# Patient Record
Sex: Female | Born: 1970 | Race: Black or African American | Hispanic: No | Smoking: Never smoker
Health system: Southern US, Community
[De-identification: ages and names within clinical notes are randomized; demographics above are authoritative.]

## PROBLEM LIST (undated history)

## (undated) DIAGNOSIS — N760 Acute vaginitis: Secondary | ICD-10-CM

## (undated) DIAGNOSIS — IMO0002 Reserved for concepts with insufficient information to code with codable children: Secondary | ICD-10-CM

## (undated) DIAGNOSIS — O99345 Other mental disorders complicating the puerperium: Secondary | ICD-10-CM

## (undated) DIAGNOSIS — B3731 Acute candidiasis of vulva and vagina: Secondary | ICD-10-CM

## (undated) DIAGNOSIS — N6320 Unspecified lump in the left breast, unspecified quadrant: Secondary | ICD-10-CM

## (undated) DIAGNOSIS — K5900 Constipation, unspecified: Secondary | ICD-10-CM

## (undated) DIAGNOSIS — K649 Unspecified hemorrhoids: Secondary | ICD-10-CM

## (undated) DIAGNOSIS — B9689 Other specified bacterial agents as the cause of diseases classified elsewhere: Secondary | ICD-10-CM

## (undated) DIAGNOSIS — L309 Dermatitis, unspecified: Secondary | ICD-10-CM

## (undated) DIAGNOSIS — M79662 Pain in left lower leg: Secondary | ICD-10-CM

## (undated) DIAGNOSIS — N6019 Diffuse cystic mastopathy of unspecified breast: Secondary | ICD-10-CM

## (undated) DIAGNOSIS — R519 Headache, unspecified: Secondary | ICD-10-CM

## (undated) DIAGNOSIS — N83209 Unspecified ovarian cyst, unspecified side: Secondary | ICD-10-CM

## (undated) DIAGNOSIS — B373 Candidiasis of vulva and vagina: Secondary | ICD-10-CM

## (undated) DIAGNOSIS — F53 Postpartum depression: Secondary | ICD-10-CM

## (undated) DIAGNOSIS — R6882 Decreased libido: Secondary | ICD-10-CM

## (undated) DIAGNOSIS — M79661 Pain in right lower leg: Secondary | ICD-10-CM

## (undated) DIAGNOSIS — D649 Anemia, unspecified: Secondary | ICD-10-CM

## (undated) DIAGNOSIS — G4700 Insomnia, unspecified: Secondary | ICD-10-CM

## (undated) HISTORY — DX: Acute candidiasis of vulva and vagina: B37.31

## (undated) HISTORY — DX: Diffuse cystic mastopathy of unspecified breast: N60.19

## (undated) HISTORY — DX: Constipation, unspecified: K59.00

## (undated) HISTORY — DX: Other mental disorders complicating the puerperium: O99.345

## (undated) HISTORY — PX: ABDOMINAL HYSTERECTOMY: SHX81

## (undated) HISTORY — DX: Dermatitis, unspecified: L30.9

## (undated) HISTORY — DX: Postpartum depression: F53.0

## (undated) HISTORY — DX: Other specified bacterial agents as the cause of diseases classified elsewhere: B96.89

## (undated) HISTORY — DX: Insomnia, unspecified: G47.00

## (undated) HISTORY — DX: Reserved for concepts with insufficient information to code with codable children: IMO0002

## (undated) HISTORY — DX: Candidiasis of vulva and vagina: B37.3

## (undated) HISTORY — DX: Unspecified lump in the left breast, unspecified quadrant: N63.20

## (undated) HISTORY — DX: Pain in left lower leg: M79.662

## (undated) HISTORY — PX: TUBAL LIGATION: SHX77

## (undated) HISTORY — DX: Unspecified hemorrhoids: K64.9

## (undated) HISTORY — DX: Decreased libido: R68.82

## (undated) HISTORY — DX: Pain in right lower leg: M79.661

## (undated) HISTORY — DX: Acute vaginitis: N76.0

## (undated) HISTORY — DX: Unspecified ovarian cyst, unspecified side: N83.209

---
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
## 1995-08-05 DIAGNOSIS — R87619 Unspecified abnormal cytological findings in specimens from cervix uteri: Secondary | ICD-10-CM

## 1995-08-05 DIAGNOSIS — IMO0002 Reserved for concepts with insufficient information to code with codable children: Secondary | ICD-10-CM

## 1995-08-05 HISTORY — DX: Reserved for concepts with insufficient information to code with codable children: IMO0002

## 1995-08-05 HISTORY — DX: Unspecified abnormal cytological findings in specimens from cervix uteri: R87.619

## 2001-01-23 ENCOUNTER — Other Ambulatory Visit: Admission: RE | Admit: 2001-01-23 | Discharge: 2001-01-23 | Payer: Self-pay | Admitting: Obstetrics and Gynecology

## 2001-01-30 ENCOUNTER — Encounter: Admission: RE | Admit: 2001-01-30 | Discharge: 2001-01-30 | Payer: Self-pay | Admitting: Obstetrics and Gynecology

## 2001-01-30 ENCOUNTER — Encounter: Payer: Self-pay | Admitting: Obstetrics and Gynecology

## 2002-01-22 ENCOUNTER — Other Ambulatory Visit: Admission: RE | Admit: 2002-01-22 | Discharge: 2002-01-22 | Payer: Self-pay | Admitting: Obstetrics and Gynecology

## 2002-02-06 ENCOUNTER — Encounter: Payer: Self-pay | Admitting: Obstetrics and Gynecology

## 2002-02-06 ENCOUNTER — Encounter: Admission: RE | Admit: 2002-02-06 | Discharge: 2002-02-06 | Payer: Self-pay | Admitting: Obstetrics and Gynecology

## 2003-03-14 ENCOUNTER — Inpatient Hospital Stay (HOSPITAL_COMMUNITY): Admission: AD | Admit: 2003-03-14 | Discharge: 2003-03-16 | Payer: Self-pay | Admitting: Obstetrics and Gynecology

## 2003-05-31 ENCOUNTER — Other Ambulatory Visit: Admission: RE | Admit: 2003-05-31 | Discharge: 2003-05-31 | Payer: Self-pay | Admitting: Obstetrics and Gynecology

## 2003-08-03 HISTORY — PX: BREAST CYST EXCISION: SHX579

## 2004-05-08 ENCOUNTER — Other Ambulatory Visit: Admission: RE | Admit: 2004-05-08 | Discharge: 2004-05-08 | Payer: Self-pay | Admitting: Obstetrics and Gynecology

## 2004-08-02 HISTORY — PX: TUBAL LIGATION: SHX77

## 2004-08-12 ENCOUNTER — Encounter: Admission: RE | Admit: 2004-08-12 | Discharge: 2004-08-12 | Payer: Self-pay | Admitting: Obstetrics and Gynecology

## 2004-08-28 ENCOUNTER — Encounter (INDEPENDENT_AMBULATORY_CARE_PROVIDER_SITE_OTHER): Payer: Self-pay | Admitting: Specialist

## 2004-08-28 ENCOUNTER — Ambulatory Visit (HOSPITAL_COMMUNITY): Admission: RE | Admit: 2004-08-28 | Discharge: 2004-08-28 | Payer: Self-pay | Admitting: *Deleted

## 2004-10-11 ENCOUNTER — Inpatient Hospital Stay (HOSPITAL_COMMUNITY): Admission: AD | Admit: 2004-10-11 | Discharge: 2004-10-13 | Payer: Self-pay | Admitting: Obstetrics and Gynecology

## 2004-10-14 ENCOUNTER — Encounter: Admission: RE | Admit: 2004-10-14 | Discharge: 2004-11-13 | Payer: Self-pay | Admitting: Obstetrics and Gynecology

## 2004-11-14 ENCOUNTER — Encounter: Admission: RE | Admit: 2004-11-14 | Discharge: 2004-12-14 | Payer: Self-pay | Admitting: Obstetrics and Gynecology

## 2006-02-18 ENCOUNTER — Other Ambulatory Visit: Admission: RE | Admit: 2006-02-18 | Discharge: 2006-02-18 | Payer: Self-pay | Admitting: Obstetrics and Gynecology

## 2006-02-22 ENCOUNTER — Encounter: Admission: RE | Admit: 2006-02-22 | Discharge: 2006-02-22 | Payer: Self-pay | Admitting: Obstetrics and Gynecology

## 2007-02-20 DIAGNOSIS — M79661 Pain in right lower leg: Secondary | ICD-10-CM

## 2007-02-20 HISTORY — DX: Pain in right lower leg: M79.661

## 2007-02-21 ENCOUNTER — Ambulatory Visit (HOSPITAL_COMMUNITY): Admission: RE | Admit: 2007-02-21 | Discharge: 2007-02-21 | Payer: Self-pay | Admitting: Obstetrics and Gynecology

## 2007-02-21 ENCOUNTER — Ambulatory Visit: Payer: Self-pay | Admitting: Vascular Surgery

## 2007-03-30 ENCOUNTER — Ambulatory Visit (HOSPITAL_COMMUNITY): Admission: RE | Admit: 2007-03-30 | Discharge: 2007-03-30 | Payer: Self-pay | Admitting: Obstetrics and Gynecology

## 2007-08-10 ENCOUNTER — Encounter: Admission: RE | Admit: 2007-08-10 | Discharge: 2007-08-10 | Payer: Self-pay | Admitting: Obstetrics and Gynecology

## 2008-08-20 ENCOUNTER — Encounter: Admission: RE | Admit: 2008-08-20 | Discharge: 2008-08-20 | Payer: Self-pay | Admitting: Obstetrics and Gynecology

## 2009-08-22 ENCOUNTER — Encounter: Admission: RE | Admit: 2009-08-22 | Discharge: 2009-08-22 | Payer: Self-pay | Admitting: Obstetrics and Gynecology

## 2010-08-24 ENCOUNTER — Encounter
Admission: RE | Admit: 2010-08-24 | Discharge: 2010-08-24 | Payer: Self-pay | Source: Home / Self Care | Attending: Obstetrics and Gynecology | Admitting: Obstetrics and Gynecology

## 2010-12-15 NOTE — Op Note (Signed)
[name], [name]               ACCOUNT NO.:  [id number]   MEDICAL RECORD NO.:  [id number]          PATIENT TYPE:  AMB   LOCATION:  SDC                           [facility]   PHYSICIAN:  Janine Limbo, M.D.DATE OF BIRTH:  Jan 25, 1971   DATE OF PROCEDURE:  03/30/2007  DATE OF DISCHARGE:                               OPERATIVE REPORT   PREOPERATIVE DIAGNOSIS:  Desires sterilization.   POSTOPERATIVE DIAGNOSIS:  Desires sterilization.   PROCEDURE:  Laparoscopic tubal cautery.   SURGEON:  Leonard Schwartz, MD   FIRST ASSISTANT:  None.   ANESTHETIC:  General.   DISPOSITION:  Ms. Rawlinson is a 40 year old female, para 3-2-3, who desires  sterilization.  She understands the indications for her surgical  procedure and she accepts the risks of, but not limited to, anesthetic  complications, bleeding, infections, possible damage to surrounding  organs, and possible tubal failure (24 per 1000).   FINDINGS:  The uterus, fallopian tubes and the ovaries were normal.  The  bowel was carefully inspected and the bowel appeared normal.  The  appendix and the liver appeared normal.  The gallbladder was visualized  and it appeared normal.   PROCEDURE:  The patient was taken to the operating room, where a general  anesthetic was given.  The patient's abdomen, perineum and vagina were  prepped with multiple layers of Betadine.  The bladder was drained of  urine.  A Hulka tenaculum was placed inside the uterus.  The patient was  sterilely draped.  The subumbilical area was injected with 5 mL of 0.5%  Marcaine with epinephrine.  An subumbilical incision was made and the  Veress needle was inserted into the abdominal cavity without difficulty.  Proper placement was confirmed using the saline drop test.  A  pneumoperitoneum was then obtained.  The laparoscopic trocar and then  the laparoscope were substituted for the Veress needle.  The pelvis was  visualized with findings as mentioned  above.  The right fallopian tube  was identified and followed to its fimbriated end.  The proximal portion  of the right fallopian tube was cauterized in several segments using the  bipolar cautery.  Hemostasis was adequate.  An identical procedure was  carried out on the opposite side.  Again hemostasis was adequate.  The  bowel was carefully inspected and there was no evidence of damage to the  bowel or any of the other pelvic structures.  The pneumoperitoneum was  allowed to escape.  The trocar was removed.  The fascia was closed using  a figure-of-eight suture of 0 Vicryl.  The skin was reapproximated using  a subcuticular suture of 3-0 Monocryl.  Sponge, needle and instrument  counts were correct on two occasions.  The estimated blood loss was less  than 2 mL.  The patient tolerated her procedure well.  She was awakened  from her anesthetic without difficulty and taken to the recovery room in  stable condition.   FOLLOW-UP INSTRUCTIONS:  The patient was given a prescription for  ibuprofen and she will take 800 mg every 8 hours as needed for  mild to  moderate pain.  She will take Vicodin one or two tablets every 4 hours  as needed  for severe pain.  She will return to see Dr. Stefano Gaul in 2-3 weeks for a  follow-up examination.  She was given a copy of the postoperative  instruction sheet as prepared by the [facility] for  patients who have undergone a laparoscopy.  She was told to call for  questions or concerns.      Janine Limbo, M.D.  Electronically Signed     AVS/MEDQ  D:  03/30/2007  T:  03/31/2007  Job:  [id number]
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
## 2010-12-18 NOTE — Op Note (Signed)
[name], [name]             ACCOUNT NO.:  [id number]   MEDICAL RECORD NO.:  [id number]          PATIENT TYPE:  AMB   LOCATION:  SDC                           [facility]   PHYSICIAN:  Vikki Ports, MDDATE OF BIRTH:  22-Jan-1971   DATE OF PROCEDURE:  08/28/2004  DATE OF DISCHARGE:                                 OPERATIVE REPORT   PREOPERATIVE DIAGNOSIS:  Right breast mass.   POSTOPERATIVE DIAGNOSIS:  Right breast mass.   PROCEDURE:  Excisional right breast biopsy.   SURGEON:  Danna Hefty, M.D.   ANESTHESIA:  Local MAC.   DESCRIPTION OF PROCEDURE:  The patient was taken to the operating room and  placed in a supine position.  After adequate MAC anesthesia was induced, the  right breast was prepped and draped in the normal sterile fashion.  Using 1%  lidocaine local anesthesia, the skin, subcutaneous tissues, and subumbilical  region of the right breast was anesthetized.  A curvilinear incision was  made.  I dissected down to a firm mass which was excised in its entirety and  sent for pathologic evaluation.  The skin was closed with subcuticular 4-0  Monocryl.  Steri-Strips and sterile dressings were applied.  The patient  tolerated the procedure well and went to PACU in good condition.      KRH/MEDQ  D:  08/28/2004  T:  08/29/2004  Job:  [id number]

## 2010-12-18 NOTE — H&P (Signed)
Wong, Caitlin                       ACCOUNT NO.:  [id number]   MEDICAL RECORD NO.:  [id number]                   PATIENT TYPE:  INP   LOCATION:  9164                                 [facility]   PHYSICIAN:  Janine Limbo, M.D.            DATE OF BIRTH:  [birth year]   DATE OF ADMISSION:  03/14/2003  DATE OF DISCHARGE:                                HISTORY & PHYSICAL   HISTORY OF PRESENT ILLNESS:  This is a 40 year old, gravida 4, para 1-0-2-1,  at 40-1/7 weeks who presents with contractions since 3 a.m.  She denies  leaking or bleeding.  Reports positive fetal movement.  Pregnancy has been  followed by the certified nurse midwife service and remarkable for:  1)  Unsure dates.  2) Group B Strep negative.   PRENATAL LABORATORY DATA:  Hemoglobin 11.6, platelets 315, blood type A  positive, antibody screen negative, sickle cell screen negative. RPR  nonreactive. Rubella immune.  Hepatitis B surface antigen negative.  HIV  nonreactive.  Pap test normal. Gonorrhea negative.  Chlamydia negative.  Cystic fibrosis negative.  Glucose Challenge within normal limits.  Quad  screen within normal limits.  Group B Strep negative.   PAST OBSTETRICAL HISTORY:  Remarkable for an elective abortion in 1992 at 12  weeks with no complications.  Vaginal delivery in 1997 of a female infant at  [redacted] weeks gestation weighing 8 pounds 2 ounces, complicated by meconium fluid  and an unknown type of abortion in 2003 at six weeks gestation with no  complications.   PAST MEDICAL HISTORY:  Remarkable for an abnormal Pap smear for which she  had colposcopy in 1997 and normal Pap smears ever since.  History of  Gonorrhea at age 53.  History of HPV. Childhood varicella.  History of past  marijuana use.   PAST SURGICAL HISTORY:  Remarkable for an elective abortion in 1992 and a  wisdom teeth extraction in 2003.   FAMILY HISTORY:  Remarkable for grandfather with heart disease.  Mother,  grandfather, and aunt with hypertension.  Mother and aunt with diabetes.  Sister with sarcoidosis.  Sister with breast cancer.  Grandmother with a  stroke. Mother with seizures.  Genetic history is unremarkable.   SOCIAL HISTORY:  The patient is single, but involved with Berenice Primas who is  involved and supportive. She is of the [employer]. She denies any current  alcohol, tobacco, or drug use.   PHYSICAL EXAMINATION:  VITAL SIGNS:  Stable, afebrile.  HEENT:  Within normal limits.  Thyroid normal, not enlarged.  CHEST:  Clear to auscultation.  HEART:  Regular rate and rhythm.  ABDOMEN: Gravid at 38 cm. Vertex to [place].  EFM shows a reactive fetal  heart rate tracing with uterine contractions every five minutes.  PELVIC: Cervix 3 cm, 90% effaced, and -1 station with vertex presentation.  EXTREMITIES:  Within normal limits.   ASSESSMENT:  1. Intrauterine pregnancy at  term.  2. Early active labor with cervical change.   PLAN:  1. Admit to birthing suite, Dr. Stefano Gaul notified.  2. Routine C.N.M. orders.  3. Anticipate SVD.     Marie L. Williams, C.N.M.                 Janine Limbo, M.D.    MLW/MEDQ  D:  03/14/2003  T:  03/14/2003  Job:  [id number]

## 2011-05-14 LAB — URINALYSIS, ROUTINE W REFLEX MICROSCOPIC
Bilirubin Urine: NEGATIVE
Specific Gravity, Urine: >1.03 — ABNORMAL HIGH
Urobilinogen, UA: 0.2
pH: 6

## 2011-05-14 LAB — CBC
MCHC: 34
MCV: 86.7
Platelets: 291
RBC: 4.07
RDW: 14.6 — ABNORMAL HIGH
WBC: 4.4

## 2011-07-23 ENCOUNTER — Other Ambulatory Visit: Payer: Self-pay | Admitting: Obstetrics and Gynecology

## 2011-07-23 DIAGNOSIS — Z1231 Encounter for screening mammogram for malignant neoplasm of breast: Secondary | ICD-10-CM

## 2011-08-26 ENCOUNTER — Ambulatory Visit
Admission: RE | Admit: 2011-08-26 | Discharge: 2011-08-26 | Disposition: A | Discharge status: Home / Self Care | Payer: 59 | Source: Ambulatory Visit | Attending: Obstetrics and Gynecology | Admitting: Obstetrics and Gynecology

## 2011-08-26 DIAGNOSIS — Z1231 Encounter for screening mammogram for malignant neoplasm of breast: Secondary | ICD-10-CM

## 2011-09-01 ENCOUNTER — Other Ambulatory Visit: Payer: Self-pay | Admitting: Obstetrics and Gynecology

## 2011-09-01 DIAGNOSIS — R928 Other abnormal and inconclusive findings on diagnostic imaging of breast: Secondary | ICD-10-CM

## 2011-09-08 ENCOUNTER — Ambulatory Visit
Admission: RE | Admit: 2011-09-08 | Discharge: 2011-09-08 | Disposition: A | Discharge status: Home / Self Care | Payer: 59 | Source: Ambulatory Visit | Attending: Obstetrics and Gynecology | Admitting: Obstetrics and Gynecology

## 2011-09-08 DIAGNOSIS — R928 Other abnormal and inconclusive findings on diagnostic imaging of breast: Secondary | ICD-10-CM

## 2012-04-04 ENCOUNTER — Other Ambulatory Visit: Payer: Self-pay | Admitting: Obstetrics and Gynecology

## 2012-04-04 DIAGNOSIS — N6009 Solitary cyst of unspecified breast: Secondary | ICD-10-CM

## 2012-04-07 ENCOUNTER — Ambulatory Visit
Admission: RE | Admit: 2012-04-07 | Discharge: 2012-04-07 | Disposition: A | Discharge status: Home / Self Care | Payer: 59 | Source: Ambulatory Visit | Attending: Obstetrics and Gynecology | Admitting: Obstetrics and Gynecology

## 2012-04-07 DIAGNOSIS — N6009 Solitary cyst of unspecified breast: Secondary | ICD-10-CM

## 2012-04-17 ENCOUNTER — Ambulatory Visit (INDEPENDENT_AMBULATORY_CARE_PROVIDER_SITE_OTHER): Payer: 59 | Admitting: Obstetrics and Gynecology

## 2012-04-17 ENCOUNTER — Encounter: Payer: Self-pay | Admitting: Obstetrics and Gynecology

## 2012-04-17 VITALS — BP 112/70 | HR 72 | Ht 66.0 in | Wt 162.0 lb

## 2012-04-17 DIAGNOSIS — Z01419 Encounter for gynecological examination (general) (routine) without abnormal findings: Secondary | ICD-10-CM

## 2012-04-17 DIAGNOSIS — N6009 Solitary cyst of unspecified breast: Secondary | ICD-10-CM

## 2012-04-17 DIAGNOSIS — Z113 Encounter for screening for infections with a predominantly sexual mode of transmission: Secondary | ICD-10-CM

## 2012-04-17 DIAGNOSIS — Z124 Encounter for screening for malignant neoplasm of cervix: Secondary | ICD-10-CM

## 2012-04-17 NOTE — Progress Notes (Signed)
Subjective:    [name] is a 41 y.o. female, G5P3, who presents for an annual exam. The patient denies any complaints.  Menstrual cycle:   LMP: Patient's last menstrual period was 04/05/2012.            Review of Systems Pertinent items are noted in HPI. Denies pelvic pain, urinary tract symptoms, vaginitis symptoms, irregular bleeding, menopausal symptoms, change in bowel habits or rectal bleeding   Objective:    BP 112/70  Pulse 72  Ht 5\' 6"  (1.676 m)  Wt 162 lb (73.483 kg)  BMI 26.15 kg/m2  LMP 04/05/2012   Wt Readings from Last 1 Encounters:  04/17/12 162 lb (73.483 kg)   Body mass index is 26.15 kg/(m^2). General Appearance: Alert, no acute distress HEENT: Grossly normal Neck / Thyroid: Supple, no thyromegaly or cervical adenopathy Lungs: Clear to auscultation bilaterally Back: No CVA tenderness Breast Exam: No focal  masses but fibrocystic breast changes;No dimpling, nipple retraction or discharge. Cardiovascular: Regular rate and rhythm.  Gastrointestinal: Soft, non-tender, no masses or organomegaly Pelvic Exam: EGBUS-wnl, vagina-normal rugae, cervix- without lesions or tenderness, uterus appears normal size shape and consistency, adnexae-no masses or tenderness Lymphatic Exam: Non-palpable nodes in neck, clavicular,  axillary, or inguinal regions  Skin: no rashes or abnormalities Extremities: no clubbing cyanosis or edema  Neurologic: grossly normal Psychiatric: Alert and oriented   Assessment:   Routine GYN Exam First Degree Relative Breast Cancer Stable Right Breast Cysts (per mammography)   Plan:  STD testing   Offered consultation with a general for breast cysts offered, patient declined for now.  Will continue radiographic monitoring  PAP sent RTO 1 year or prn  Janelie Goltz,ELMIRAPA-C

## 2012-04-17 NOTE — Progress Notes (Signed)
Regular Periods: yes Mammogram: yes  Monthly Breast Ex.: no Exercise: yes  Tetanus < 10 years: no Seatbelts: yes  NI. Bladder Functn.: yes Abuse at home: no  Daily BM's: no Stressful Work: no  Healthy Diet: yes Sigmoid-Colonoscopy: 1996  Calcium: no Medical problems this year: NO PROBLEMS   LAST PAP:9/12  NL  Contraception: BTL  Mammogram:  3/13  FOUND 3 MASSES   HAD U/S; OKAY  PCP: NO  PMH: NO CHANGE  FMH: NO CHANGE  Last Bone Scan: 2002   PT IS MARRIED.

## 2012-04-18 LAB — HEPATITIS B SURFACE ANTIGEN: Hepatitis B Surface Ag: NEGATIVE

## 2012-04-19 LAB — PAP IG, CT-NG, RFX HPV ASCU: GC Probe Amp: NEGATIVE

## 2012-08-29 ENCOUNTER — Other Ambulatory Visit: Payer: Self-pay | Admitting: Obstetrics and Gynecology

## 2012-08-29 DIAGNOSIS — N6009 Solitary cyst of unspecified breast: Secondary | ICD-10-CM

## 2012-09-11 ENCOUNTER — Ambulatory Visit
Admission: RE | Admit: 2012-09-11 | Discharge: 2012-09-11 | Disposition: A | Discharge status: Home / Self Care | Payer: 59 | Source: Ambulatory Visit | Attending: Obstetrics and Gynecology | Admitting: Obstetrics and Gynecology

## 2012-09-11 DIAGNOSIS — N6009 Solitary cyst of unspecified breast: Secondary | ICD-10-CM

## 2013-06-26 ENCOUNTER — Emergency Department (HOSPITAL_BASED_OUTPATIENT_CLINIC_OR_DEPARTMENT_OTHER)
Admission: EM | Admit: 2013-06-26 | Discharge: 2013-06-26 | Disposition: A | Discharge status: Home / Self Care | Payer: 59 | Attending: Emergency Medicine | Admitting: Emergency Medicine

## 2013-06-26 ENCOUNTER — Emergency Department (HOSPITAL_BASED_OUTPATIENT_CLINIC_OR_DEPARTMENT_OTHER): Payer: 59

## 2013-06-26 ENCOUNTER — Encounter (HOSPITAL_BASED_OUTPATIENT_CLINIC_OR_DEPARTMENT_OTHER): Payer: Self-pay | Admitting: Emergency Medicine

## 2013-06-26 DIAGNOSIS — Z8719 Personal history of other diseases of the digestive system: Secondary | ICD-10-CM | POA: Insufficient documentation

## 2013-06-26 DIAGNOSIS — Z8619 Personal history of other infectious and parasitic diseases: Secondary | ICD-10-CM | POA: Insufficient documentation

## 2013-06-26 DIAGNOSIS — R109 Unspecified abdominal pain: Secondary | ICD-10-CM

## 2013-06-26 DIAGNOSIS — Z87448 Personal history of other diseases of urinary system: Secondary | ICD-10-CM | POA: Insufficient documentation

## 2013-06-26 DIAGNOSIS — Z3202 Encounter for pregnancy test, result negative: Secondary | ICD-10-CM | POA: Insufficient documentation

## 2013-06-26 DIAGNOSIS — R1032 Left lower quadrant pain: Secondary | ICD-10-CM | POA: Insufficient documentation

## 2013-06-26 DIAGNOSIS — Z8742 Personal history of other diseases of the female genital tract: Secondary | ICD-10-CM | POA: Insufficient documentation

## 2013-06-26 DIAGNOSIS — Z9851 Tubal ligation status: Secondary | ICD-10-CM | POA: Insufficient documentation

## 2013-06-26 DIAGNOSIS — Z8679 Personal history of other diseases of the circulatory system: Secondary | ICD-10-CM | POA: Insufficient documentation

## 2013-06-26 DIAGNOSIS — Z872 Personal history of diseases of the skin and subcutaneous tissue: Secondary | ICD-10-CM | POA: Insufficient documentation

## 2013-06-26 DIAGNOSIS — Z8659 Personal history of other mental and behavioral disorders: Secondary | ICD-10-CM | POA: Insufficient documentation

## 2013-06-26 DIAGNOSIS — Z87440 Personal history of urinary (tract) infections: Secondary | ICD-10-CM | POA: Insufficient documentation

## 2013-06-26 LAB — COMPREHENSIVE METABOLIC PANEL
ALT: 7 U/L (ref 0–35)
Albumin: 4 g/dL (ref 3.5–5.2)
Alkaline Phosphatase: 33 U/L — ABNORMAL LOW (ref 39–117)
Calcium: 9.2 mg/dL (ref 8.4–10.5)
GFR calc Af Amer: >90 mL/min (ref >90)
Glucose, Bld: 87 mg/dL (ref 70–99)
Sodium: 139 mEq/L (ref 135–145)
Total Protein: 7.4 g/dL (ref 6.0–8.3)

## 2013-06-26 LAB — CBC
HCT: 38.3 % (ref 36.0–46.0)
Hemoglobin: 12.2 g/dL (ref 12.0–15.0)
MCH: 28.5 pg (ref 26.0–34.0)
MCHC: 31.9 g/dL (ref 30.0–36.0)
MCV: 89.5 fL (ref 78.0–100.0)
Platelets: 268 K/uL (ref 150–400)
RBC: 4.28 MIL/uL (ref 3.87–5.11)
RDW: 13.8 % (ref 11.5–15.5)
WBC: 5.8 K/uL (ref 4.0–10.5)

## 2013-06-26 LAB — POCT I-STAT, CHEM 8
Chloride: 105 mEq/L (ref 96–112)
Creatinine, Ser: 0.7 mg/dL (ref 0.50–1.10)
Glucose, Bld: 87 mg/dL (ref 70–99)
HCT: 40 % (ref 36.0–46.0)
Hemoglobin: 13.6 g/dL (ref 12.0–15.0)
Potassium: 4 mEq/L (ref 3.5–5.1)
Sodium: 142 mEq/L (ref 135–145)
TCO2: 26 mmol/L (ref 0–100)

## 2013-06-26 LAB — PREGNANCY, URINE: Preg Test, Ur: NEGATIVE

## 2013-06-26 LAB — URINALYSIS, ROUTINE W REFLEX MICROSCOPIC
Glucose, UA: NEGATIVE mg/dL
Ketones, ur: NEGATIVE mg/dL
Protein, ur: NEGATIVE mg/dL
Specific Gravity, Urine: 1.025 (ref 1.005–1.030)
Urobilinogen, UA: 1 mg/dL (ref 0.0–1.0)
pH: 7 (ref 5.0–8.0)

## 2013-06-26 LAB — LIPASE, BLOOD: Lipase: 30 U/L (ref 11–59)

## 2013-06-26 MED ORDER — IOHEXOL 300 MG/ML  SOLN
50.0000 mL | Freq: Once | INTRAMUSCULAR | Status: AC | PRN
  Administered 2013-06-26: 50 mL via ORAL

## 2013-06-26 MED ORDER — MORPHINE SULFATE 4 MG/ML IJ SOLN
4.0000 mg | Freq: Once | INTRAMUSCULAR | Status: AC
  Administered 2013-06-26: 4 mg via INTRAVENOUS
  Filled 2013-06-26: qty 1

## 2013-06-26 MED ORDER — IOHEXOL 300 MG/ML  SOLN
100.0000 mL | Freq: Once | INTRAMUSCULAR | Status: AC | PRN
  Administered 2013-06-26: 100 mL via INTRAVENOUS

## 2013-06-26 MED ORDER — MORPHINE SULFATE 2 MG/ML IJ SOLN
2.0000 mg | Freq: Once | INTRAMUSCULAR | Status: AC
  Administered 2013-06-26: 2 mg via INTRAVENOUS
  Filled 2013-06-26: qty 1

## 2013-06-26 MED ORDER — HYDROCODONE-ACETAMINOPHEN 5-325 MG PO TABS: 1.0000 | ORAL_TABLET | Freq: Four times a day (QID) | ORAL | Status: DC | PRN

## 2013-06-26 NOTE — ED Notes (Signed)
Pt c/o left flank pain for "over a year" and sts the pain has been worse past 2 wks. Pt denies n/v/d.

## 2013-06-26 NOTE — ED Provider Notes (Signed)
CSN: [id number]     Arrival date & time 06/26/13  1054 History   First MD Initiated Contact with Patient 06/26/13 1103     Chief Complaint  Patient presents with  . Flank Pain   (Consider location/radiation/quality/duration/timing/severity/associated sxs/prior Treatment) Patient is a 42 y.o. female presenting with flank pain.  Flank Pain Pertinent negatives include no abdominal pain, chest pain, chills, coughing, fever, headaches or sore throat.    42 y/o female with nearly 20 year Hx of L flank pain taht is drastically increased for the last 2 weeks. She states that the pain started without apparent cause, trauma, or rash and has continued for years ata reasonable mild to moderate level. Recently she has ahd an increase in pain describing 11/10 (worse than childbirth) L flank pain described as a pressure like pain radiating straight up. It is worsened by laying on it and is not alleviated by anything. She was previously told it may be constipation so she took mag citrate this weekend which produced several stool sand only worsened the pain. She also endorses shallow breathing as it hurts to take deep inspiration.   Past Medical History  Diagnosis Date  . Post partum depression   . Eczema   . BV (bacterial vaginosis)   . Candidal vulvovaginitis   . Decreased libido   . Bilateral calf pain 02/20/07  . Left breast mass   . Insomnia   . Hemorrhoids   . Constipation   . Simple ovarian cyst   . Fibrocystic breast   . Abnormal Pap smear 08/05/95    LSIL CIN1   Past Surgical History  Procedure Laterality Date  . Tubal ligation    . Breast cyst excision  2005  . Knee surgery  1986    LEFT   Family History  Problem Relation Age of Onset  . Diabetes Mother   . Alcohol abuse Mother   . Cancer Sister     BREAST  . Diabetes Maternal Grandmother   . Aneurysm Maternal Grandmother   . Cancer Paternal Grandmother   . Hypertension Sister   . Sarcoidosis Sister    History  Substance  Use Topics  . Smoking status: Never Smoker   . Smokeless tobacco: Never Used  . Alcohol Use: Yes   OB History   Grav Para Term Preterm Abortions TAB SAB Ect Mult Living   5 3        3      Review of Systems  Constitutional: Negative for fever, chills and appetite change.  HENT: Negative for sore throat.   Respiratory: Negative for cough and shortness of breath.        Shallow breathing  Cardiovascular: Negative for chest pain.  Gastrointestinal: Negative for abdominal pain.  Genitourinary: Positive for flank pain. Negative for dysuria.  Neurological: Negative for headaches.  All other systems reviewed and are negative.    Allergies  Review of patient's allergies indicates no known allergies.  Home Medications   Current Outpatient Rx  Name  Route  Sig  Dispense  Refill  . HYDROcodone-acetaminophen (NORCO) 5-325 MG per tablet   Oral   Take 1 tablet by mouth every 6 (six) hours as needed for moderate pain.   30 tablet   0    BP 105/74  Pulse 66  Temp(Src) 97.9 F (36.6 C) (Oral)  Resp 18  SpO2 100%  LMP 06/23/2013 Physical Exam  Nursing note and vitals reviewed. Constitutional: She is oriented to person, place, and time. She appears  well-developed and well-nourished. No distress.  HENT:  Head: Normocephalic and atraumatic.  Eyes: EOM are normal. Pupils are equal, round, and reactive to light.  Neck: Neck supple.  Cardiovascular: Normal rate, regular rhythm and normal heart sounds.   No murmur heard. Pulmonary/Chest: Effort normal and breath sounds normal.  Abdominal: Soft. Bowel sounds are normal. There is no tenderness. There is no guarding.  Musculoskeletal: She exhibits no edema.  Neurological: She is alert and oriented to person, place, and time.  Skin: Skin is warm and dry. No rash noted. She is not diaphoretic.  Psychiatric: She has a normal mood and affect.    ED Course  Procedures (including critical care time) Labs Review Labs Reviewed   URINALYSIS, ROUTINE W REFLEX MICROSCOPIC - Abnormal; Notable for the following:    APPearance CLOUDY (*)    All other components within normal limits  COMPREHENSIVE METABOLIC PANEL - Abnormal; Notable for the following:    Alkaline Phosphatase 33 (*)    All other components within normal limits  POCT I-STAT, CHEM 8 - Abnormal; Notable for the following:    Calcium, Ion 1.26 (*)    All other components within normal limits  CBC  PREGNANCY, URINE  LIPASE, BLOOD   Imaging Review Ct Abdomen Pelvis W Contrast  06/26/2013   CLINICAL DATA:  Left lower quadrant pain  EXAM: CT ABDOMEN AND PELVIS WITH CONTRAST  TECHNIQUE: Multidetector CT imaging of the abdomen and pelvis was performed using the standard protocol following bolus administration of intravenous contrast.  CONTRAST:  50mL OMNIPAQUE IOHEXOL 300 MG/ML SOLN, OMNIPAQUE IOHEXOL 300 MG/ML SOLN  COMPARISON:  None.  FINDINGS: Diffuse hepatic steatosis.  The gallbladder, spleen, adrenal gland, and kidneys are within normal limits.  The pancreatic parenchyma is unremarkable. The pancreatic duct is mildly dilated at 5 mm.  Normal appendix.  Moderate stool burden throughout the length of the colon.  Bladder is decompressed. Uterus and adnexa are within normal limits. Trace free fluid is seen layering in the pelvis.  The stomach is distended with oral contrast.  No acute bony deformity.  IMPRESSION: Mild dilatation of the pancreatic duct without mass. Correlate with amylase and lipase as for the need for MRCP.  Diffuse hepatic steatosis.   Electronically Signed   By: Maryclare Bean M.D.   On: 06/26/2013 12:50    EKG Interpretation   None       MDM   1. Abdominal pain     42 y/o female here with exacerbation of chronic L flank pain. Labs completely unrevealing with normal LFTs and lipase. CT abdomen with only shows mildly dilated pancreatic duct. Discussed findings with patient recommend PCP and GI followup. Did contact GI to discuss case he  feels comfortable starting patient with close followup.  Pain control here with IV morphine Rx given for by mouth Percocet  Red flags provided, return for worsening symptoms.  Murtis Sink, MD [facility] Resident, PGY-2 06/26/2013, 3:30 PM       Elenora Gamma, MD 06/26/13 [id number]

## 2013-06-26 NOTE — ED Notes (Signed)
Patient preparing for discharge. 

## 2013-06-26 NOTE — ED Provider Notes (Signed)
I saw and evaluated the patient, reviewed the resident's note and I agree with the findings and plan.  EKG Interpretation   None       Patient here with chronic abdominal pain worse with past 2 weeks. Belly is benign with some mild left-sided tenderness to palpation. CT scan shows mildly dilated common bile duct. GI states this, moderate dilation is likely normal and is not in acute followup. She is to followup provided from her PCP. Stable for discharge  Dagmar Hait, MD 06/26/13 1539

## 2013-07-05 ENCOUNTER — Other Ambulatory Visit (INDEPENDENT_AMBULATORY_CARE_PROVIDER_SITE_OTHER): Payer: 59

## 2013-07-05 ENCOUNTER — Ambulatory Visit (INDEPENDENT_AMBULATORY_CARE_PROVIDER_SITE_OTHER): Payer: 59 | Admitting: Gastroenterology

## 2013-07-05 ENCOUNTER — Encounter: Payer: Self-pay | Admitting: Gastroenterology

## 2013-07-05 VITALS — BP 98/60 | HR 93 | Ht 65.5 in | Wt 146.2 lb

## 2013-07-05 DIAGNOSIS — R1032 Left lower quadrant pain: Secondary | ICD-10-CM

## 2013-07-05 DIAGNOSIS — K8689 Other specified diseases of pancreas: Secondary | ICD-10-CM

## 2013-07-05 DIAGNOSIS — K5900 Constipation, unspecified: Secondary | ICD-10-CM

## 2013-07-05 DIAGNOSIS — R1012 Left upper quadrant pain: Secondary | ICD-10-CM

## 2013-07-05 LAB — IBC PANEL
Iron: 39 ug/dL — ABNORMAL LOW (ref 42–145)
Saturation Ratios: 10.7 % — ABNORMAL LOW (ref 20.0–50.0)
Transferrin: 259.9 mg/dL (ref 212.0–360.0)

## 2013-07-05 LAB — VITAMIN B12: Vitamin B-12: 233 pg/mL (ref 211–911)

## 2013-07-05 LAB — C-REACTIVE PROTEIN: CRP: <0.5 mg/dL (ref 0.5–20.0)

## 2013-07-05 LAB — LIPASE: Lipase: 27 U/L (ref 11.0–59.0)

## 2013-07-05 LAB — FOLATE: Folate: 12.9 ng/mL (ref >5.9)

## 2013-07-05 MED ORDER — NA SULFATE-K SULFATE-MG SULF 17.5-3.13-1.6 GM/177ML PO SOLN: ORAL | Status: DC

## 2013-07-05 MED ORDER — TAPENTADOL HCL 50 MG PO TABS: 50.0000 mg | ORAL_TABLET | Freq: Four times a day (QID) | ORAL | Status: DC | PRN

## 2013-07-05 MED ORDER — LINACLOTIDE 290 MCG PO CAPS: 290.0000 ug | ORAL_CAPSULE | Freq: Every day | ORAL | Status: DC

## 2013-07-05 MED ORDER — CILIDINIUM-CHLORDIAZEPOXIDE 2.5-5 MG PO CAPS: 1.0000 | ORAL_CAPSULE | Freq: Three times a day (TID) | ORAL | Status: DC

## 2013-07-05 NOTE — Patient Instructions (Addendum)
You have been scheduled for an endoscopy and colonoscopy with propofol. Please follow the written instructions given to you at your visit today. Please pick up your prep at the pharmacy within the next 1-3 days. If you use inhalers (even only as needed), please bring them with you on the day of your procedure. Your physician has requested that you go to www.startemmi.com and enter the access code given to you at your visit today. This web site gives a general overview about your procedure. However, you should still follow specific instructions given to you by our office regarding your preparation for the procedure.  Please purchase Miralax over the counter and mix one capful in one glass of water or juice and drink at bedtime.  We have given you samples of the following medication to take: Linzess 290 mcg, please take one capsule on an empty stomach once daily.  We have sent the following medications to your pharmacy for you to pick up at your convenience: Librax 5-2.5 mg, please take one capsule by mouth three times daily as needed  Nucynta 50 mg, please take one tablet by mouth every six hours as needed for pain   Please go to basement for lab work

## 2013-07-05 NOTE — Progress Notes (Signed)
History of Present Illness:  This is a 42 year old African American female who has had one year of left flank pain with negative urinalysis.  Her pain is associated with mild functional constipation and is relocated left lower quadrant.  She was recently in the emergency room on November 25 with severe left-sided pain, underwent CT scan which was normal except for 5 mm pancreatic duct.  Liver function tests, amylase and lipase were normal.  Patient says her pain is only alleviated by laying on her left side.  His not made worse by food, but she has had some mild anorexia and worsening constipation.  She has a bowel movement now every 3-4 days.  She denies a known history of hepatitis or pancreatitis, abuse of alcohol, cigarettes, or NSAIDs.  She denies reflux symptoms or dysphagia.  Her family history is positive for colon polyps in her mother and her sister.  She denies a specific food intolerances.  She denies systemic complaints such as fever, chills, nausea vomiting, skin rashes, joint pains et Karie Soda.  She's not had previous endoscopic exams or colonoscopy.  The patient relates that morphine and hydrocodone do not seem to help her pain which is very unusual.  Review of her labs shows normal liver function tests, amylase, lipase, CBC,  Urinalysis, negative pregnancy test.l  I have reviewed this patient's present history, medical and surgical past history, allergies and medications.     ROS:   All systems were reviewed and are negative unless otherwise stated in the HPI.    Physical Exam: Blood pressure 90/60, pulse 93 and regular and weight 146 with a BMI of 23.96. General well developed well nourished patient in no acute distress, appearing their stated age Eyes PERRLA, no icterus, fundoscopic exam per opthamologist Skin no lesions noted Neck supple, no adenopathy, no thyroid enlargement, no tenderness Chest clear to percussion and auscultation Heart no significant murmurs, gallops or rubs  noted Abdomen no hepatosplenomegaly masses or tenderness, BS normal.  Tender spasm of a mobile sigmoid colon which appears to be full of stool.  Is no rebound tenderness. Rectal inspection normal no fissures, or fistulae noted.  No masses or tenderness on digital exam. Stool guaiac negative. Extremities no acute joint lesions, edema, phlebitis or evidence of cellulitis. Neurologic patient oriented x 3, cranial nerves intact, no focal neurologic deficits noted. Psychological mental status normal and normal affect.  Assessment and plan: Her pain seemed to be localized over her colon area and is associated with chronic constipation.  I've scheduled colonoscopy exam, also endoscopy be complete because of her abnormal CT scan.  I do not think she has any evidence of pancreatic disease by lab exam, but a 5 mm PD and her pain radiating to her left upper quadrant is worrisome for possible chronic pancreatic issues.  I have ordered repeat amylase, lipase, CRP, sedimentation rate, IgG subclass 4 for autoimmune pancreatitis, and have started Linzess 290 mcg a day for constipation with MiraLax at bedtime, Nucynta 50 mg every 6-8 hours for pain, Librax one by mouth 3 times a day, and we will do her procedures next week.  She we have no answer to her problems from above workup, we'll proceed with MRCP versus endoscopic ultrasonography exam of her pancreas and biliary tree.

## 2013-07-06 ENCOUNTER — Encounter: Payer: Self-pay | Admitting: Gastroenterology

## 2013-07-09 ENCOUNTER — Ambulatory Visit (AMBULATORY_SURGERY_CENTER): Payer: 59 | Admitting: Gastroenterology

## 2013-07-09 ENCOUNTER — Other Ambulatory Visit: Payer: Self-pay

## 2013-07-09 ENCOUNTER — Encounter: Payer: Self-pay | Admitting: Gastroenterology

## 2013-07-09 VITALS — BP 124/78 | HR 74 | Temp 98.2°F | Resp 12 | Ht 65.5 in | Wt 146.0 lb

## 2013-07-09 DIAGNOSIS — K8689 Other specified diseases of pancreas: Secondary | ICD-10-CM

## 2013-07-09 DIAGNOSIS — Z8371 Family history of colonic polyps: Secondary | ICD-10-CM

## 2013-07-09 DIAGNOSIS — K219 Gastro-esophageal reflux disease without esophagitis: Secondary | ICD-10-CM

## 2013-07-09 DIAGNOSIS — R1032 Left lower quadrant pain: Secondary | ICD-10-CM

## 2013-07-09 LAB — IGG SUBCLASSES
IgG Subclass 1: 645 mg/dL (ref 382–929)
IgG Subclass 2: 361 mg/dL (ref 241–700)
IgG Subclass 4: 6.1 mg/dL (ref 4.0–86.0)

## 2013-07-09 MED ORDER — ESOMEPRAZOLE MAGNESIUM 40 MG PO CPDR: 40.0000 mg | DELAYED_RELEASE_CAPSULE | Freq: Every day | ORAL | Status: DC

## 2013-07-09 MED ORDER — CILIDINIUM-CHLORDIAZEPOXIDE 2.5-5 MG PO CAPS: 1.0000 | ORAL_CAPSULE | Freq: Three times a day (TID) | ORAL | Status: DC

## 2013-07-09 MED ORDER — LINACLOTIDE 290 MCG PO CAPS: 290.0000 ug | ORAL_CAPSULE | Freq: Every day | ORAL | Status: DC

## 2013-07-09 MED ORDER — SODIUM CHLORIDE 0.9 % IV SOLN: 500.0000 mL | INTRAVENOUS | Status: DC

## 2013-07-09 NOTE — Op Note (Signed)
[facility] [address] [place], [address]   COLONOSCOPY PROCEDURE REPORT  PATIENT: [name], [name]  MR#: [id number] BIRTHDATE: 1971-01-22 , 42  yrs. old GENDER: Female ENDOSCOPIST: Mardella Layman, MD, [facility] REFERRED BY: PROCEDURE DATE:  07/09/2013 PROCEDURE:   Colonoscopy, screening First Screening Colonoscopy - Avg.  risk and is 50 yrs.  old or older Yes.  Prior Negative Screening - Now for repeat screening. N/A  History of Adenoma - Now for follow-up colonoscopy & has been > or = to 3 yrs.  N/A ASA CLASS:   Class I INDICATIONS:average risk screening and abdominal pain in the lower left quadrant. MEDICATIONS: propofol (Diprivan) 350mg  IV  DESCRIPTION OF PROCEDURE:   After the risks benefits and alternatives of the procedure were thoroughly explained, informed consent was obtained.  A digital rectal exam revealed no abnormalities of the rectum.   The LB [id number]  endoscope was introduced through the anus and advanced to the   . No adverse events experienced.   The quality of the prep was excellent, using MoviPrep  The instrument was then slowly withdrawn as the colon was fully examined.      COLON FINDINGS: A normal appearing cecum, ileocecal valve, and appendiceal orifice were identified.  The ascending, hepatic flexure, transverse, splenic flexure, descending, sigmoid colon and rectum appeared unremarkable.  No polyps or cancers were seen. Retroflexed views revealed no abnormalities. The time to cecum=6 minutes 45 seconds.  Withdrawal time=7 minutes 29 seconds.  The scope was withdrawn and the procedure completed. COMPLICATIONS: There were no complications.  ENDOSCOPIC IMPRESSION: Normal colon ...no cause for severe abdominal pain syndrome.....?? IBS vs UGI source vs pancreatic problem.  RECOMMENDATIONS: Upper Endoscopy   eSigned:  Mardella Layman, MD, [facility] 07/09/2013 2:36 PM   cc:

## 2013-07-09 NOTE — Progress Notes (Signed)
Report to pacu rn, vss, bbs=clear 

## 2013-07-09 NOTE — Progress Notes (Signed)
Patient did not experience any of the following events: a burn prior to discharge; a fall within the facility; wrong site/side/patient/procedure/implant event; or a hospital transfer or hospital admission upon discharge from the facility. (G8907) Patient did not have preoperative order for IV antibiotic SSI prophylaxis. (G8918)  

## 2013-07-09 NOTE — Progress Notes (Signed)
Per Dr. Jarold Motto, samples of Linzess 290 mcg sent up stairs and rx for Nexium 40mg  sent in to [facility] , also faxed printed rx refill for his librax to [facility] .

## 2013-07-09 NOTE — Patient Instructions (Addendum)

## 2013-07-09 NOTE — Progress Notes (Signed)
Patient did not have preoperative order for IV antibiotic SSI prophylaxis. (G8918)  Patient did not experience any of the following events: a burn prior to discharge; a fall within the facility; wrong site/side/patient/procedure/implant event; or a hospital transfer or hospital admission upon discharge from the facility. (G8907)  

## 2013-07-09 NOTE — Op Note (Signed)
[facility] [address] [place], [address]   ENDOSCOPY PROCEDURE REPORT  PATIENT: [name], [name]  MR#: [id number] BIRTHDATE: 16-Feb-1971 , 42  yrs. old GENDER: Female ENDOSCOPIST:David Hale Bogus, MD, [facility] REFERRED BY: PROCEDURE DATE:  07/09/2013 PROCEDURE:   EGD w/ biopsy for H.pylori ASA CLASS:    Class I INDICATIONS: MEDICATION: There was residual sedation effect present from prior procedure and Propofol (Diprivan) 110 mg IV TOPICAL ANESTHETIC:  DESCRIPTION OF PROCEDURE:   After the risks and benefits of the procedure were explained, informed consent was obtained.  The LB [id number]  endoscope was introduced through the mouth  and advanced to the second portion of the duodenum .  The instrument was slowly withdrawn as the mucosa was fully examined.    The upper, middle and distal third of the esophagus were carefully inspected and no abnormalities were noted.  The z-line was well seen at the GEJ.  The endoscope was pushed into the fundus which was normal including a retroflexed view.  The antrum, gastric body, first and second part of the duodenum were unremarkable. Retroflexed views revealed no abnormalities.    The scope was then withdrawn from the patient and the procedure completed.  COMPLICATIONS: There were no complications.   ENDOSCOPIC IMPRESSION: Normal EGD.Marland KitchenMarland Kitchen?? functional problems.Marland KitchenMarland KitchenDr Christella Hartigan reviewed CT scan and I agree there is no significence of pancreatic disease.  RECOMMENDATIONS: Continue current medications...add librax tid,benefiber,linzess 290 mcg/day,OV 3 weeks    _______________________________ eSigned:  Mardella Layman, MD, [facility] 07/09/2013 2:44 PM

## 2013-07-10 ENCOUNTER — Encounter: Payer: Self-pay | Admitting: Gastroenterology

## 2013-07-10 ENCOUNTER — Telehealth: Payer: Self-pay | Admitting: *Deleted

## 2013-07-10 NOTE — Telephone Encounter (Signed)
I received via fax from [facility] prior auth needed for Nexium  Number to call is [phone]  I called patient at home number, no answer and cell phone number stated mail box is full cannot leave messages I need to know if patient tried Omeprazole, if not will have to try Omeprazole first

## 2013-07-10 NOTE — Telephone Encounter (Signed)
No answer, left message to call if questions or concerns. 

## 2013-07-11 ENCOUNTER — Telehealth: Payer: Self-pay | Admitting: Gastroenterology

## 2013-07-12 NOTE — Telephone Encounter (Signed)
Notes Recorded by Mardella Layman, MD on 07/06/2013 at 8:16 AM Start daily tandem and also B12 shots and nasal spray.       Assessment and plan: Her pain seemed to be localized over her colon area and is associated with chronic constipation. I've scheduled colonoscopy exam, also endoscopy be complete because of her abnormal CT scan. I do not think she has any evidence of pancreatic disease by lab exam, but a 5 mm PD and her pain radiating to her left upper quadrant is worrisome for possible chronic pancreatic issues. I have ordered repeat amylase, lipase, CRP, sedimentation rate, IgG subclass 4 for autoimmune pancreatitis, and have started Linzess 290 mcg a day for constipation with MiraLax at bedtime, Nucynta 50 mg every 6-8 hours for pain, Librax one by mouth 3 times a day, and we will do her procedures next week. She we have no answer to her problems from above workup, we'll proceed with MRCP versus endoscopic ultrasonography exam of her pancreas and biliary tree.   lmom for pt to call back. Labs were normal.

## 2013-07-19 HISTORY — PX: COLONOSCOPY WITH ESOPHAGOGASTRODUODENOSCOPY (EGD): SHX5779

## 2013-07-24 ENCOUNTER — Encounter: Payer: Self-pay | Admitting: Gastroenterology

## 2013-07-31 ENCOUNTER — Encounter: Payer: Self-pay | Admitting: *Deleted

## 2013-08-07 ENCOUNTER — Ambulatory Visit: Payer: 59 | Admitting: Gastroenterology

## 2013-08-08 ENCOUNTER — Other Ambulatory Visit: Payer: Self-pay

## 2013-08-08 DIAGNOSIS — Z1231 Encounter for screening mammogram for malignant neoplasm of breast: Secondary | ICD-10-CM

## 2013-08-27 NOTE — Telephone Encounter (Signed)
Mailed letter to pt requesting she call us for scripts; also mailed copies of her labs.

## 2013-09-13 ENCOUNTER — Ambulatory Visit
Admission: RE | Admit: 2013-09-13 | Discharge: 2013-09-13 | Disposition: A | Discharge status: Home / Self Care | Payer: 59 | Source: Ambulatory Visit

## 2013-09-13 DIAGNOSIS — Z1231 Encounter for screening mammogram for malignant neoplasm of breast: Secondary | ICD-10-CM

## 2013-09-14 ENCOUNTER — Telehealth: Payer: Self-pay | Admitting: Gastroenterology

## 2013-09-14 MED ORDER — CYANOCOBALAMIN 500 MCG/0.1ML NA SOLN: NASAL | Status: DC

## 2013-09-14 MED ORDER — FERROUS FUM-IRON POLYSACCH 162-115.2 MG PO CAPS: 1.0000 | ORAL_CAPSULE | Freq: Every day | ORAL | Status: DC

## 2013-09-14 NOTE — Telephone Encounter (Signed)
Spoke with patient and rx sent to pharmacy. Nascobal sample up front with savings card for pick up.

## 2013-10-29 ENCOUNTER — Telehealth: Payer: Self-pay | Admitting: Gastroenterology

## 2013-10-30 MED ORDER — CYANOCOBALAMIN 500 MCG/0.1ML NA SOLN: NASAL | Status: DC

## 2013-10-30 NOTE — Telephone Encounter (Signed)
Unable to reach patient.

## 2013-10-30 NOTE — Telephone Encounter (Signed)
Spoke with patient and she has been using one spray in each nostril weekly and has run out of her medication early. Discussed correct dose. Sample up front for pick up.

## 2014-06-03 ENCOUNTER — Encounter: Payer: Self-pay | Admitting: Gastroenterology

## 2014-09-10 ENCOUNTER — Other Ambulatory Visit: Payer: Self-pay

## 2014-09-10 DIAGNOSIS — Z1231 Encounter for screening mammogram for malignant neoplasm of breast: Secondary | ICD-10-CM

## 2014-09-19 ENCOUNTER — Ambulatory Visit
Admission: RE | Admit: 2014-09-19 | Discharge: 2014-09-19 | Disposition: A | Discharge status: Home / Self Care | Payer: 59 | Source: Ambulatory Visit

## 2014-09-19 ENCOUNTER — Encounter (INDEPENDENT_AMBULATORY_CARE_PROVIDER_SITE_OTHER): Payer: Self-pay

## 2014-09-19 DIAGNOSIS — Z1231 Encounter for screening mammogram for malignant neoplasm of breast: Secondary | ICD-10-CM

## 2014-09-20 ENCOUNTER — Other Ambulatory Visit: Payer: Self-pay | Admitting: Obstetrics and Gynecology

## 2014-09-20 DIAGNOSIS — R928 Other abnormal and inconclusive findings on diagnostic imaging of breast: Secondary | ICD-10-CM

## 2014-09-30 ENCOUNTER — Ambulatory Visit
Admission: RE | Admit: 2014-09-30 | Discharge: 2014-09-30 | Disposition: A | Discharge status: Home / Self Care | Payer: 59 | Source: Ambulatory Visit | Attending: Obstetrics and Gynecology | Admitting: Obstetrics and Gynecology

## 2014-09-30 DIAGNOSIS — R928 Other abnormal and inconclusive findings on diagnostic imaging of breast: Secondary | ICD-10-CM

## 2017-08-16 ENCOUNTER — Other Ambulatory Visit: Payer: Self-pay | Admitting: Obstetrics and Gynecology

## 2017-08-16 DIAGNOSIS — Z139 Encounter for screening, unspecified: Secondary | ICD-10-CM

## 2017-08-24 ENCOUNTER — Encounter (HOSPITAL_COMMUNITY): Payer: Self-pay | Admitting: Obstetrics and Gynecology

## 2017-09-07 ENCOUNTER — Ambulatory Visit: Payer: 59

## 2017-10-20 ENCOUNTER — Ambulatory Visit
Admission: RE | Admit: 2017-10-20 | Discharge: 2017-10-20 | Disposition: A | Discharge status: Home / Self Care | Payer: 59 | Source: Ambulatory Visit | Attending: Obstetrics and Gynecology | Admitting: Obstetrics and Gynecology

## 2017-10-20 DIAGNOSIS — Z139 Encounter for screening, unspecified: Secondary | ICD-10-CM

## 2018-09-08 ENCOUNTER — Ambulatory Visit
Admission: EM | Admit: 2018-09-08 | Discharge: 2018-09-08 | Disposition: A | Discharge status: Home / Self Care | Payer: Medicaid Other | Attending: Family Medicine | Admitting: Family Medicine

## 2018-09-08 ENCOUNTER — Encounter: Payer: Self-pay | Admitting: Emergency Medicine

## 2018-09-08 DIAGNOSIS — R0982 Postnasal drip: Secondary | ICD-10-CM | POA: Diagnosis not present

## 2018-09-08 DIAGNOSIS — R0789 Other chest pain: Secondary | ICD-10-CM

## 2018-09-08 DIAGNOSIS — R05 Cough: Secondary | ICD-10-CM

## 2018-09-08 DIAGNOSIS — J029 Acute pharyngitis, unspecified: Secondary | ICD-10-CM

## 2018-09-08 DIAGNOSIS — R52 Pain, unspecified: Secondary | ICD-10-CM

## 2018-09-08 DIAGNOSIS — J111 Influenza due to unidentified influenza virus with other respiratory manifestations: Secondary | ICD-10-CM

## 2018-09-08 DIAGNOSIS — R110 Nausea: Secondary | ICD-10-CM

## 2018-09-08 DIAGNOSIS — R51 Headache: Secondary | ICD-10-CM | POA: Diagnosis not present

## 2018-09-08 DIAGNOSIS — R69 Illness, unspecified: Principal | ICD-10-CM

## 2018-09-08 DIAGNOSIS — R509 Fever, unspecified: Secondary | ICD-10-CM

## 2018-09-08 MED ORDER — IBUPROFEN 600 MG PO TABS: 600.0000 mg | ORAL_TABLET | Freq: Four times a day (QID) | ORAL | 0 refills | Status: DC | PRN

## 2018-09-08 MED ORDER — ONDANSETRON 4 MG PO TBDP: 4.0000 mg | ORAL_TABLET | Freq: Three times a day (TID) | ORAL | 0 refills | Status: DC | PRN

## 2018-09-08 MED ORDER — OSELTAMIVIR PHOSPHATE 75 MG PO CAPS: 75.0000 mg | ORAL_CAPSULE | Freq: Two times a day (BID) | ORAL | 0 refills | Status: DC

## 2018-09-08 MED ORDER — BENZONATATE 200 MG PO CAPS: 200.0000 mg | ORAL_CAPSULE | Freq: Three times a day (TID) | ORAL | 0 refills | Status: AC | PRN

## 2018-09-08 NOTE — Discharge Instructions (Signed)
Symptoms suggestive of a flu-like illness, May begin tamiflu twice daily for the next 5 days  1. Take a daily allergy pill/anti-histamine like Zyrtec, Claritin, or Store brand consistently for 2 weeks  2. For congestion you may try an oral decongestant like Mucinex or sudafed. You may also try intranasal flonase nasal spray or saline irrigations (neti pot, sinus cleanse)  3. For your sore throat you may try cepacol lozenges, salt water gargles, throat spray. Treatment of congestion may also help your sore throat.  4. For cough you may try Tessalon provided or delsym, Robitussen, Mucinex DM  5. Take Tylenol every 4-6 hours, Ibuprofen every 8 to help with body aches, headache pain/inflammation  6. Stay hydrated, drink plenty of fluids to keep throat coated and less irritated  Honey Tea For cough/sore throat try using a honey-based tea. Use 3 teaspoons of honey with juice squeezed from half lemon. Place shaved pieces of ginger into 1/2-1 cup of water and warm over stove top. Then mix the ingredients and repeat every 4 hours as needed.

## 2018-09-08 NOTE — ED Triage Notes (Signed)
Pt presents to [facility] for assessment of cough, body aches, hot and cold feeling, headache, nausea x 2 days

## 2018-09-08 NOTE — ED Provider Notes (Addendum)
[facility]    CSN: [id number] Arrival date & time: 09/08/18  1701     History   Chief Complaint Chief Complaint  Patient presents with  . URI    HPI [name] is a 48 y.o. female no significant past medical history presenting today for evaluation of cough, body aches and headache.  Patient states that beginning yesterday she began with a cough.  Cough is been productive and has felt chest congestion.  States she has developed discomfort over her chest that worsens with coughing and breathing.  She feels a pressure sensation.  She has had minimal nasal congestion and sore throat with this.  She is also had some mild nausea, chills.  Has felt feverish, but no known measurement.  She has not taken anything for her symptoms.  HPI  Past Medical History:  Diagnosis Date  . Abnormal Pap smear 08/05/95   LSIL CIN1  . Bilateral calf pain 02/20/07  . BV (bacterial vaginosis)   . Candidal vulvovaginitis   . Constipation   . Decreased libido   . Eczema   . Fibrocystic breast   . Hemorrhoids   . Insomnia   . Left breast mass   . Post partum depression   . Simple ovarian cyst     Patient Active Problem List   Diagnosis Date Noted  . Benign breast cyst in female 04/17/2012    Past Surgical History:  Procedure Laterality Date  . BREAST CYST EXCISION Left 2005  . KNEE SURGERY Left 1986  . TUBAL LIGATION      OB History    Gravida  5   Para  3   Term      Preterm      AB      Living  3     SAB      TAB      Ectopic      Multiple      Live Births               Home Medications    Prior to Admission medications   Medication Sig Start Date End Date Taking? Authorizing Provider  benzonatate (TESSALON) 200 MG capsule Take 1 capsule (200 mg total) by mouth 3 (three) times daily as needed for up to 7 days for cough. 09/08/18 09/15/18  Wieters, Hallie C, PA-C  clidinium-chlordiazePOXIDE (LIBRAX) 5-2.5 MG per capsule Take 1 capsule by mouth 3  (three) times daily before meals. 07/09/13   Sable Feil, MD  Cyanocobalamin (NASCOBAL) 500 MCG/0.1ML SOLN One spray into one nostril once a week 09/14/13   Sable Feil, MD  Cyanocobalamin (NASCOBAL) 500 MCG/0.1ML SOLN One spray into one nostril once weekly. LOt [id number] exp Mar 2016 10/30/13   Esterwood, Amy S, PA-C  esomeprazole (NEXIUM) 40 MG capsule Take 1 capsule (40 mg total) by mouth daily at 12 noon. 07/09/13   Sable Feil, MD  ferrous fumarate-iron polysaccharide complex (TANDEM) 162-115.2 MG CAPS Take 1 capsule by mouth daily with breakfast. 09/14/13   Sable Feil, MD  ibuprofen (ADVIL,MOTRIN) 600 MG tablet Take 1 tablet (600 mg total) by mouth every 6 (six) hours as needed. 09/08/18   Wieters, Hallie C, PA-C  Linaclotide (LINZESS) 290 MCG CAPS capsule Take 1 capsule (290 mcg total) by mouth daily. 07/09/13   Sable Feil, MD  ondansetron (ZOFRAN-ODT) 4 MG disintegrating tablet Take 1 tablet (4 mg total) by mouth every 8 (eight) hours as needed for nausea  or vomiting. 09/08/18   Wieters, Hallie C, PA-C  oseltamivir (TAMIFLU) 75 MG capsule Take 1 capsule (75 mg total) by mouth every 12 (twelve) hours. 09/08/18   Wieters, Hallie C, PA-C  tapentadol (NUCYNTA) 50 MG TABS tablet Take 1 tablet (50 mg total) by mouth every 6 (six) hours as needed. 07/05/13   Sable Feil, MD    Family History Family History  Problem Relation Age of Onset  . Breast cancer Sister   . Diabetes Maternal Grandmother   . Aneurysm Maternal Grandmother   . Cancer Paternal Grandmother        type unknown  . Diabetes Mother   . Alcohol abuse Mother   . Colon polyps Mother   . Hypertension Sister   . Sarcoidosis Sister   . Colon polyps Sister     Social History Social History   Tobacco Use  . Smoking status: Never Smoker  . Smokeless tobacco: Never Used  Substance Use Topics  . Alcohol use: Yes  . Drug use: No     Allergies   Patient has no known allergies.   Review of  Systems Review of Systems  Constitutional: Positive for appetite change, chills, fatigue and fever. Negative for activity change.  HENT: Positive for congestion. Negative for ear pain, rhinorrhea, sinus pressure, sore throat and trouble swallowing.   Eyes: Negative for discharge and redness.  Respiratory: Positive for cough and chest tightness. Negative for shortness of breath.   Cardiovascular: Negative for chest pain.  Gastrointestinal: Positive for nausea. Negative for abdominal pain, diarrhea and vomiting.  Musculoskeletal: Positive for myalgias.  Skin: Negative for rash.  Neurological: Positive for headaches. Negative for dizziness and light-headedness.     Physical Exam Triage Vital Signs ED Triage Vitals  Enc Vitals Group     BP 09/08/18 1711 116/76     Pulse Rate 09/08/18 1711 (!) 104     Resp 09/08/18 1711 18     Temp 09/08/18 1711 99.5 F (37.5 C)     Temp Source 09/08/18 1711 Oral     SpO2 09/08/18 1711 97 %     Weight --      Height --      Head Circumference --      Peak Flow --      Pain Score 09/08/18 1712 8     Pain Loc --      Pain Edu? --      Excl. in [place]? --    No data found.  Updated Vital Signs BP 116/76 (BP Location: Right Arm)   Pulse (!) 104   Temp 99.5 F (37.5 C) (Oral)   Resp 18   LMP 06/28/2018   SpO2 97%   Visual Acuity Right Eye Distance:   Left Eye Distance:   Bilateral Distance:    Right Eye Near:   Left Eye Near:    Bilateral Near:     Physical Exam Vitals signs and nursing note reviewed.  Constitutional:      General: She is not in acute distress.    Appearance: She is well-developed.  HENT:     Head: Normocephalic and atraumatic.     Ears:     Comments: Bilateral ears without tenderness to palpation of external auricle, tragus and mastoid, EAC's without erythema or swelling, TM's with good bony landmarks and cone of light. Non erythematous.    Nose:     Comments: Nasal mucosa pink, not swollen turbinates     Mouth/Throat:  Comments: Oral mucosa pink and moist, no tonsillar enlargement or exudate. Posterior pharynx patent and nonerythematous, no uvula deviation or swelling. Normal phonation. Eyes:     Conjunctiva/sclera: Conjunctivae normal.  Neck:     Musculoskeletal: Neck supple.  Cardiovascular:     Rate and Rhythm: Regular rhythm. Tachycardia present.     Heart sounds: No murmur.  Pulmonary:     Effort: Pulmonary effort is normal. No respiratory distress.     Breath sounds: Normal breath sounds.     Comments: Breathing comfortably at rest, CTABL, no wheezing, rales or other adventitious sounds auscultated  Mild chest discomfort to palpation of anterior chest, patient states that this is different than the sensation she is feeling Abdominal:     Palpations: Abdomen is soft.     Tenderness: There is no abdominal tenderness.  Skin:    General: Skin is warm and dry.  Neurological:     Mental Status: She is alert.      UC Treatments / Results  Labs (all labs ordered are listed, but only abnormal results are displayed) Labs Reviewed - No data to display  EKG None  Radiology No results found.  Procedures Procedures (including critical care time)  Medications Ordered in UC Medications - No data to display  Initial Impression / Assessment and Plan / UC Course  I have reviewed the triage vital signs and the nursing notes.  Pertinent labs & imaging results that were available during my care of the patient were reviewed by me and considered in my medical decision making (see chart for details).     Patient with acute onset of cough, body aches, fevers and chills with associated chest discomfort.  Patient slightly tachycardic, but likely related to low-grade temperature.  At this time suspect underlying viral URI/influenza.  Chest discomfort seems less likely related to pneumonia, ACS or PE, lungs clear.  Will treat symptomatically and supportively for cough, body aches and  nausea, will initiate Tamiflu.  Will have patient continue to monitor chest discomfort and breathing,Discussed strict return precautions. Patient verbalized understanding and is agreeable with plan.  Denied history of hypertension, diabetes.  No smoking history.  Previous tubal ligation, not on oral contraceptives.  Final Clinical Impressions(s) / UC Diagnoses   Final diagnoses:  Influenza-like illness     Discharge Instructions     Symptoms suggestive of a flu-like illness, May begin tamiflu twice daily for the next 5 days  1. Take a daily allergy pill/anti-histamine like Zyrtec, Claritin, or Store brand consistently for 2 weeks  2. For congestion you may try an oral decongestant like Mucinex or sudafed. You may also try intranasal flonase nasal spray or saline irrigations (neti pot, sinus cleanse)  3. For your sore throat you may try cepacol lozenges, salt water gargles, throat spray. Treatment of congestion may also help your sore throat.  4. For cough you may try Tessalon provided or delsym, Robitussen, Mucinex DM  5. Take Tylenol every 4-6 hours, Ibuprofen every 8 to help with body aches, headache pain/inflammation  6. Stay hydrated, drink plenty of fluids to keep throat coated and less irritated  Honey Tea For cough/sore throat try using a honey-based tea. Use 3 teaspoons of honey with juice squeezed from half lemon. Place shaved pieces of ginger into 1/2-1 cup of water and warm over stove top. Then mix the ingredients and repeat every 4 hours as needed.   ED Prescriptions    Medication Sig Dispense Auth. Provider   oseltamivir (TAMIFLU) 75  MG capsule Take 1 capsule (75 mg total) by mouth every 12 (twelve) hours. 10 capsule Wieters, Hallie C, PA-C   ibuprofen (ADVIL,MOTRIN) 600 MG tablet Take 1 tablet (600 mg total) by mouth every 6 (six) hours as needed. 30 tablet Wieters, Hallie C, PA-C   ondansetron (ZOFRAN-ODT) 4 MG disintegrating tablet Take 1 tablet (4 mg total) by  mouth every 8 (eight) hours as needed for nausea or vomiting. 20 tablet Wieters, Hallie C, PA-C   benzonatate (TESSALON) 200 MG capsule Take 1 capsule (200 mg total) by mouth 3 (three) times daily as needed for up to 7 days for cough. 28 capsule Wieters, Hallie C, PA-C     Controlled Substance Prescriptions [place] Controlled Substance Registry consulted? Not Applicable   Janith Lima, PA-C 09/08/18 1810    Janith Lima, [place] 09/08/18 1819

## 2018-09-08 NOTE — ED Notes (Signed)
Patient able to ambulate independently  

## 2018-09-29 ENCOUNTER — Other Ambulatory Visit: Payer: Self-pay | Admitting: Obstetrics and Gynecology

## 2018-09-29 DIAGNOSIS — Z1231 Encounter for screening mammogram for malignant neoplasm of breast: Secondary | ICD-10-CM

## 2018-10-27 ENCOUNTER — Ambulatory Visit: Payer: Medicaid Other

## 2018-11-22 ENCOUNTER — Ambulatory Visit: Payer: Medicaid Other

## 2019-01-24 ENCOUNTER — Other Ambulatory Visit: Payer: Self-pay

## 2019-01-24 ENCOUNTER — Ambulatory Visit
Admission: RE | Admit: 2019-01-24 | Discharge: 2019-01-24 | Disposition: A | Discharge status: Home / Self Care | Payer: Medicaid Other | Source: Ambulatory Visit | Attending: Obstetrics and Gynecology | Admitting: Obstetrics and Gynecology

## 2019-01-24 DIAGNOSIS — Z1231 Encounter for screening mammogram for malignant neoplasm of breast: Secondary | ICD-10-CM

## 2019-06-04 ENCOUNTER — Telehealth: Payer: Medicaid Other | Admitting: Physician Assistant

## 2019-06-04 DIAGNOSIS — R05 Cough: Secondary | ICD-10-CM

## 2019-06-04 DIAGNOSIS — J069 Acute upper respiratory infection, unspecified: Secondary | ICD-10-CM

## 2019-06-04 DIAGNOSIS — R059 Cough, unspecified: Secondary | ICD-10-CM

## 2019-06-04 MED ORDER — BENZONATATE 100 MG PO CAPS: 100.0000 mg | ORAL_CAPSULE | Freq: Three times a day (TID) | ORAL | 0 refills | Status: DC | PRN

## 2019-06-04 MED ORDER — FLUTICASONE PROPIONATE 50 MCG/ACT NA SUSP: 2.0000 | Freq: Every day | NASAL | 6 refills | Status: DC

## 2019-06-04 NOTE — Progress Notes (Signed)
We are sorry you are not feeling well.  Here is how we plan to help!  Based on what you have shared with me, it looks like you may have a viral upper respiratory infection.  Upper respiratory infections are caused by a large number of viruses; however, rhinovirus is the most common cause.  However, your current symptoms could be consistent with the coronavirus. [facility] has multiple testing sites - you do not need an appointment or a lab order. For information on our COVID testing locations and hours go to [email or web address]  Please quarantine yourself while awaiting your test results.  We are enrolling you in our [place] for [place] . Daily you will receive a questionnaire within the [place] website. Our COVID 19 response team willl be monitoriing your responses daily.   COVID-19 is a respiratory illness with symptoms that are similar to the flu. Symptoms are typically mild to moderate, but there have been cases of severe illness and death due to the virus. The following symptoms may appear 2-14 days after exposure: . Fever . Cough . Shortness of breath or difficulty breathing . Chills . Repeated shaking with chills . Muscle pain . Headache . Sore throat . New loss of taste or smell . Fatigue . Congestion or runny nose . Nausea or vomiting . Diarrhea  It is vitally important that if you feel that you have an infection such as this virus or any other virus that you stay home and away from places where you may spread it to others.  You should self-quarantine for 14 days if you have symptoms that could potentially be coronavirus or have been in close contact a with a person diagnosed with COVID-19 within the last 2 weeks. You should avoid contact with people age 9 and older.   You should wear a mask or cloth face covering over your nose and mouth if you must be around other people or animals, including pets (even at home). Try to stay at least 6  feet away from other people. This will protect the people around you.    For nasal congestion, you may use an oral decongestants such as Mucinex D or if you have glaucoma or high blood pressure use plain Mucinex.  Saline nasal spray or nasal drops can help and can safely be used as often as needed for congestion.  For your congestion, I have prescribed Fluticasone nasal spray one spray in each nostril twice a day  If you do not have a history of heart disease, hypertension, diabetes or thyroid disease, prostate/bladder issues or glaucoma, you may also use Sudafed to treat nasal congestion.  It is highly recommended that you consult with a pharmacist or your primary care physician to ensure this medication is safe for you to take.     If you have a cough, you may use cough suppressants such as Delsym and Robitussin.  If you have glaucoma or high blood pressure, you can also use Coricidin HBP.   For cough I have prescribed for you A prescription cough medication called Tessalon Perles 100 mg. You may take 1-2 capsules every 8 hours as needed for cough  If you have a sore or scratchy throat, use a saltwater gargle-  to  teaspoon of salt dissolved in a 4-ounce to 8-ounce glass of warm water.  Gargle the solution for approximately 15-30 seconds and then spit.  It is important not to swallow the solution.  You can also use throat lozenges/cough drops  and Chloraseptic spray to help with throat pain or discomfort.  Warm or cold liquids can also be helpful in relieving throat pain.  For headache, pain or general discomfort, you can use Ibuprofen or Tylenol as directed.   Some authorities believe that zinc sprays or the use of Echinacea may shorten the course of your symptoms.   Reduce your risk of any infection by using the same precautions used for avoiding the common cold or flu:  Mar[place] Kitchen Wash your hands often with soap and warm water for at least 20 seconds.  If soap and water are not readily available, use  an alcohol-based hand sanitizer with at least 60% alcohol.  . If coughing or sneezing, cover your mouth and nose by coughing or sneezing into the elbow areas of your shirt or coat, into a tissue or into your sleeve (not your hands). . Avoid shaking hands with others and consider head nods or verbal greetings only. . Avoid touching your eyes, nose, or mouth with unwashed hands.  . Avoid close contact with people who are sick. . Avoid places or events with large numbers of people in one location, like concerts or sporting events. . Carefully consider travel plans you have or are making. . If you are planning any travel outside or inside the [place], visit the CDC's Travelers' Health webpage for the latest health notices. . If you have some symptoms but not all symptoms, continue to monitor at home and seek medical attention if your symptoms worsen. . If you are having a medical emergency, call 911.  HOME CARE . Only take medications as instructed by your medical team. . Drink plenty of fluids and get plenty of rest. . A steam or ultrasonic humidifier can help if you have congestion.   GET HELP RIGHT AWAY IF YOU HAVE EMERGENCY WARNING SIGNS** FOR COVID-19. If you or someone is showing any of these signs seek emergency medical care immediately. Call 911 or proceed to your closest emergency facility if: . You develop worsening high fever. . Trouble breathing . Bluish lips or face . Persistent pain or pressure in the chest . New confusion . Inability to wake or stay awake . You cough up blood. . Your symptoms become more severe  **This list is not all possible symptoms. Contact your medical provider for any symptoms that are sever or concerning to you.   MAKE SURE YOU   Understand these instructions.  Will watch your condition.  Will get help right away if you are not doing well or get worse.  Your e-visit answers were reviewed by a board certified advanced clinical practitioner to complete  your personal care plan.  Depending on the condition, your plan could have included both over the counter or prescription medications.  If there is a problem please reply once you have received a response from your provider.  Your safety is important to [place].  If you have drug allergies check your prescription carefully.    You can use MyChart to ask questions about today's visit, request a non-urgent call back, or ask for a work or school excuse for 24 hours related to this e-Visit. If it has been greater than 24 hours you will need to follow up with your provider, or enter a new e-Visit to address those concerns. You will get an e-mail in the next two days asking about your experience.  I hope that your e-visit has been valuable and will speed your recovery. Thank you for using e-visits.  Greater than 5 minutes, yet less than 10 minutes of time have been spent researching, coordinating and implementing care for this patient today.   

## 2019-06-06 ENCOUNTER — Other Ambulatory Visit: Payer: Self-pay

## 2019-06-06 DIAGNOSIS — Z20822 Contact with and (suspected) exposure to covid-19: Secondary | ICD-10-CM

## 2019-06-07 LAB — NOVEL CORONAVIRUS, NAA: SARS-CoV-2, NAA: NOT DETECTED

## 2020-01-25 ENCOUNTER — Ambulatory Visit
Admission: RE | Admit: 2020-01-25 | Discharge: 2020-01-25 | Disposition: A | Discharge status: Home / Self Care | Payer: Medicaid Other | Source: Ambulatory Visit

## 2020-01-25 ENCOUNTER — Other Ambulatory Visit: Payer: Self-pay

## 2020-01-25 DIAGNOSIS — Z1231 Encounter for screening mammogram for malignant neoplasm of breast: Secondary | ICD-10-CM

## 2020-03-31 ENCOUNTER — Other Ambulatory Visit: Payer: Self-pay

## 2020-08-02 DIAGNOSIS — U071 COVID-19: Secondary | ICD-10-CM

## 2020-08-02 HISTORY — DX: COVID-19: U07.1

## 2021-04-03 ENCOUNTER — Other Ambulatory Visit: Payer: Self-pay | Admitting: Obstetrics and Gynecology

## 2021-04-03 DIAGNOSIS — N8003 Adenomyosis of the uterus: Secondary | ICD-10-CM

## 2021-04-03 DIAGNOSIS — N800 Endometriosis of uterus: Secondary | ICD-10-CM

## 2021-04-09 ENCOUNTER — Ambulatory Visit
Admission: RE | Admit: 2021-04-09 | Discharge: 2021-04-09 | Disposition: A | Discharge status: Home / Self Care | Payer: Managed Care, Other (non HMO) | Source: Ambulatory Visit | Attending: Obstetrics and Gynecology | Admitting: Obstetrics and Gynecology

## 2021-04-09 DIAGNOSIS — N8003 Adenomyosis of the uterus: Secondary | ICD-10-CM

## 2021-04-09 DIAGNOSIS — N800 Endometriosis of uterus: Secondary | ICD-10-CM

## 2021-04-09 MED ORDER — GADOBENATE DIMEGLUMINE 529 MG/ML IV SOLN
13.0000 mL | Freq: Once | INTRAVENOUS | Status: AC | PRN
  Administered 2021-04-09: 13 mL via INTRAVENOUS

## 2021-06-09 ENCOUNTER — Other Ambulatory Visit: Payer: Self-pay | Admitting: Obstetrics and Gynecology

## 2021-06-09 DIAGNOSIS — Z1231 Encounter for screening mammogram for malignant neoplasm of breast: Secondary | ICD-10-CM

## 2021-07-02 DIAGNOSIS — R002 Palpitations: Secondary | ICD-10-CM

## 2021-07-02 HISTORY — DX: Palpitations: R00.2

## 2021-07-14 ENCOUNTER — Ambulatory Visit
Admission: RE | Admit: 2021-07-14 | Discharge: 2021-07-14 | Disposition: A | Discharge status: Home / Self Care | Payer: Managed Care, Other (non HMO) | Source: Ambulatory Visit | Attending: Obstetrics and Gynecology | Admitting: Obstetrics and Gynecology

## 2021-07-14 DIAGNOSIS — Z1231 Encounter for screening mammogram for malignant neoplasm of breast: Secondary | ICD-10-CM

## 2021-07-22 ENCOUNTER — Ambulatory Visit: Payer: Managed Care, Other (non HMO) | Admitting: Cardiology

## 2021-07-22 ENCOUNTER — Encounter: Payer: Self-pay | Admitting: Cardiology

## 2021-07-22 ENCOUNTER — Other Ambulatory Visit: Payer: Self-pay

## 2021-07-22 VITALS — BP 116/84 | HR 80 | Temp 98.4°F | Resp 17 | Ht 65.5 in | Wt 169.2 lb

## 2021-07-22 DIAGNOSIS — D509 Iron deficiency anemia, unspecified: Secondary | ICD-10-CM

## 2021-07-22 DIAGNOSIS — R002 Palpitations: Secondary | ICD-10-CM

## 2021-07-22 NOTE — Progress Notes (Signed)
Date:  07/22/2021   ID:  [name], DOB [birth year], MRN [id number]  PCP:  Malena Peer, MD  Cardiologist:  Rex Kras, DO, [facility] (established care 07/22/21)   REASON FOR CONSULT: Palpitations  REQUESTING PHYSICIAN:  Chow, Bebe Shaggy, MD [place],  [place] [address]  Chief Complaint  Patient presents with   New Patient (Initial Visit)   Palpitations    HPI  [name] is a 50 y.o. African-American female who presents to the office with a chief complaint of " palpitations." Patient's past medical history and cardiovascular risk factors include: Iron deficiency anemia, uterine fibroids.  She is referred to the office at the request of Chow, Bebe Shaggy, MD for evaluation of palpitations.  Palpitations: While resting fluttering like sensation followed by "deep pump." Started about 4 weeks ago. Symptoms occur atleast once daily. Not increased in intensity, duration, or frequency. Symptoms last for few seconds and self-limited. Not associated w/ chest pain, shortness of breath, syncope, or near syncope. No meds taken for this and no interventions done.   No known thyroid disease.   History of anemia - currently on iron pills. Patient states that her last Hb was around 9g/dL. Managed by PCP.   Socially drinks wine - not daily.  One cup of coffee 3-4 days a week.  New medications include: Vitamin D and iron supplements.  No illicit, herbal supplements, energy drinks.   Works as Government social research officer at [employer].  No structured exercise program or daily routine. No history of exertional syncope.  No family history of premature coronary disease or sudden cardiac death.  ALLERGIES: No Known Allergies  MEDICATION LIST PRIOR TO VISIT: Current Meds  Medication Sig   ferrous sulfate 325 (65 FE) MG tablet Take 325 mg by mouth daily.   Fish Oil-Cholecalciferol (OMEGA-3 FISH OIL-VITAMIN D3) 1200-1000 MG-UNIT CAPS Take by mouth.     PAST MEDICAL  HISTORY: Past Medical History:  Diagnosis Date   Abnormal Pap smear 08/05/95   LSIL CIN1   Bilateral calf pain 02/20/07   BV (bacterial vaginosis)    Candidal vulvovaginitis    Constipation    Decreased libido    Eczema    Fibrocystic breast    Hemorrhoids    Insomnia    Left breast mass    Post partum depression    Simple ovarian cyst   Patient is not aware of having simple ovarian cyst.   PAST SURGICAL HISTORY: Past Surgical History:  Procedure Laterality Date   BREAST CYST EXCISION Right 2005   KNEE SURGERY Left 1986   TUBAL LIGATION      FAMILY HISTORY: The patient family history includes Alcohol abuse in her mother; Aneurysm in her maternal grandmother; Breast cancer in her cousin and sister; Cancer in her paternal grandmother; Colon polyps in her mother and sister; Dementia in her father; Dementia (age of onset: 66) in her mother; Diabetes in her maternal grandmother and mother; Hypertension in her sister; Sarcoidosis in her sister.  SOCIAL HISTORY:  The patient  reports that she has never smoked. She has never used smokeless tobacco. She reports current alcohol use. She reports that she does not use drugs.  REVIEW OF SYSTEMS: Review of Systems  Constitutional: Negative for chills and fever.  HENT:  Negative for hoarse voice and nosebleeds.   Eyes:  Negative for discharge, double vision and pain.  Cardiovascular:  Negative for chest pain, claudication, dyspnea on exertion, leg swelling, near-syncope, orthopnea, palpitations, paroxysmal nocturnal  dyspnea and syncope.  Respiratory:  Negative for hemoptysis and shortness of breath.   Musculoskeletal:  Negative for muscle cramps and myalgias.  Gastrointestinal:  Negative for abdominal pain, constipation, diarrhea, hematemesis, hematochezia, melena, nausea and vomiting.  Neurological:  Negative for dizziness and light-headedness.   PHYSICAL EXAM: Vitals with BMI 07/22/2021 09/08/2018 07/09/2013  Height 5' 5.5" - -  Weight  169 lbs 3 oz - -  BMI 40.98 - -  Systolic 119 147 829  Diastolic 84 76 78  Pulse 80 104 -    CONSTITUTIONAL: Well-developed and well-nourished. No acute distress.  SKIN: Skin is warm and dry. No rash noted. No cyanosis. No pallor. No jaundice HEAD: Normocephalic and atraumatic.  EYES: No scleral icterus MOUTH/THROAT: Moist oral membranes.  NECK: No JVD present. No thyromegaly noted. No carotid bruits  LYMPHATIC: No visible cervical adenopathy.  CHEST Normal respiratory effort. No intercostal retractions  LUNGS: Clear to auscultation bilaterally.  No stridor. No wheezes. No rales.  CARDIOVASCULAR: Regular rate and rhythm, positive S1-S2, no murmurs rubs or gallops appreciated. ABDOMINAL: Obese, soft, nontender, nondistended, positive bowel sounds all 4 quadrants.  No apparent ascites.  EXTREMITIES: No peripheral edema, warm to touch, +2 bilateral DP and PT pulses HEMATOLOGIC: No significant bruising NEUROLOGIC: Oriented to person, place, and time. Nonfocal. Normal muscle tone.  PSYCHIATRIC: Normal mood and affect. Normal behavior. Cooperative  CARDIAC DATABASE: EKG: 07/22/2021: NSR, 75bpm, normal axis, without underlying injury pattern.   Echocardiogram: No results found for this or any previous visit from the past 1095 days.    Stress Testing: No results found for this or any previous visit from the past 1095 days.   Heart Catheterization: None  LABORATORY DATA: CBC Latest Ref Rng & Units 06/26/2013 06/26/2013 03/30/2007  WBC 4.0 - 10.5 K/uL - 5.8 4.4  Hemoglobin 12.0 - 15.0 g/dL 13.6 12.2 12.0  Hematocrit 36.0 - 46.0 % 40.0 38.3 35.3(L)  Platelets 150 - 400 K/uL - 268 291    CMP Latest Ref Rng & Units 06/26/2013 06/26/2013  Glucose 70 - 99 mg/dL 87 87  BUN 6 - 23 mg/dL 11 11  Creatinine 0.50 - 1.10 mg/dL 0.70 0.60  Sodium 135 - 145 mEq/L 142 139  Potassium 3.5 - 5.1 mEq/L 4.0 4.1  Chloride 96 - 112 mEq/L 105 104  CO2 19 - 32 mEq/L - 27  Calcium 8.4 - 10.5 mg/dL -  9.2  Total Protein 6.0 - 8.3 g/dL - 7.4  Total Bilirubin 0.3 - 1.2 mg/dL - 0.7  Alkaline Phos 39 - 117 U/L - 33(L)  AST 0 - 37 U/L - 17  ALT 0 - 35 U/L - 7    Lipid Panel  No results found for: CHOL, TRIG, HDL, CHOLHDL, VLDL, LDLCALC, LDLDIRECT, LABVLDL  No components found for: NTPROBNP No results for input(s): PROBNP in the last 8760 hours. No results for input(s): TSH in the last 8760 hours.  BMP No results for input(s): NA, K, CL, CO2, GLUCOSE, BUN, CREATININE, CALCIUM, GFRNONAA, GFRAA in the last 8760 hours.  HEMOGLOBIN A1C No results found for: HGBA1C, MPG  External Labs: Collected: 04/02/2021 provided by PCP.  Iron 22 ug/dL (low) Iron sat 6% Ferritin 5 ng/mL  IMPRESSION:    ICD-10-CM   1. Palpitations  R00.2 EKG 12-Lead    LONG TERM MONITOR (3-14 DAYS)    CBC    Basic metabolic panel    TSH    2. Iron deficiency anemia, unspecified iron deficiency anemia type  D50.9  RECOMMENDATIONS: Lakena Sparlin is a 50 y.o. African-American female whose past medical history and cardiac risk factors include: Iron deficiency anemia, uterine fibroids.  Palpitations Likely due to iron deficiency anemia. No near-syncope or syncopal events. Recheck BMP, CBC, TSH. 7-day extended Holter monitor to evaluate for dysrhythmias.  Iron deficiency anemia, unspecified iron deficiency anemia type Currently on iron supplementation. Currently managed by primary care provider.  Thank you for allowing [place] to participate in the care of [place] please reach out if any questions or concerns arise.  As part of this initial consultation reviewed outside records provided by PCP which included office note, labs these findings have been summarized and noted above for further reference.  Discussed disease management, ordering diagnostic testing, coordination of care and patient education provided as a part of today's encounter.  FINAL MEDICATION LIST END OF  ENCOUNTER: No orders of the defined types were placed in this encounter.   Medications Discontinued During This Encounter  Medication Reason   ferrous fumarate-iron polysaccharide complex (TANDEM) 162-115.2 MG CAPS    fluticasone (FLONASE) 50 MCG/ACT nasal spray    ibuprofen (ADVIL,MOTRIN) 600 MG tablet    Linaclotide (LINZESS) 290 MCG CAPS capsule    ondansetron (ZOFRAN-ODT) 4 MG disintegrating tablet    oseltamivir (TAMIFLU) 75 MG capsule    tapentadol (NUCYNTA) 50 MG TABS tablet    Cyanocobalamin (NASCOBAL) 500 MCG/0.1ML SOLN    docusate sodium (COLACE) 100 MG capsule    esomeprazole (NEXIUM) 40 MG capsule    benzonatate (TESSALON) 100 MG capsule    clidinium-chlordiazePOXIDE (LIBRAX) 5-2.5 MG per capsule    Cyanocobalamin (NASCOBAL) 500 MCG/0.1ML SOLN    amoxicillin-clavulanate (AUGMENTIN) 875-125 MG tablet      Current Outpatient Medications:    ferrous sulfate 325 (65 FE) MG tablet, Take 325 mg by mouth daily., Disp: , Rfl:    Fish Oil-Cholecalciferol (OMEGA-3 FISH OIL-VITAMIN D3) 1200-1000 MG-UNIT CAPS, Take by mouth., Disp: , Rfl:   Orders Placed This Encounter  Procedures   CBC   Basic metabolic panel   TSH   LONG TERM MONITOR (3-14 DAYS)   EKG 12-Lead    There are no Patient Instructions on file for this visit.   --Continue cardiac medications as reconciled in final medication list. --Return in about 6 weeks (around 09/02/2021) for Follow up, Palpitations. Or sooner if needed. --Continue follow-up with your primary care physician regarding the management of your other chronic comorbid conditions.  Patient's questions and concerns were addressed to her satisfaction. She voices understanding of the instructions provided during this encounter.   This note was created using a voice recognition software as a result there may be grammatical errors inadvertently enclosed that do not reflect the nature of this encounter. Every attempt is made to correct such errors.  Rex Kras, [place], [facility]  Pager: [phone] Office: [phone]

## 2021-08-05 ENCOUNTER — Other Ambulatory Visit: Payer: Self-pay | Admitting: Obstetrics and Gynecology

## 2021-09-09 ENCOUNTER — Other Ambulatory Visit: Payer: Self-pay

## 2021-09-09 ENCOUNTER — Encounter (HOSPITAL_BASED_OUTPATIENT_CLINIC_OR_DEPARTMENT_OTHER): Payer: Self-pay | Admitting: Obstetrics and Gynecology

## 2021-09-09 NOTE — Progress Notes (Signed)
Spoke w/ via phone for pre-op interview---pt Lab needs dos---- urine pregnancy POCT per anesthesia              Lab results------09/22/20 Lab appt for CBC, BMP, type & screen, 07/22/21 EKG in chart & Epic COVID test -----patient states asymptomatic no test needed Arrive at -------0530 on Friday, 09/25/21 NPO after MN NO Solid Food.  Clear liquids from MN until---0430 Med rec completed Medications to take morning of surgery -----none Diabetic medication -----n/a Patient instructed no nail polish to be worn day of surgery Patient instructed to bring photo id and insurance card day of surgery Patient aware to have Driver (ride ) / caregiver    for 24 hours after surgery - Wynonia Lawman, life partner Patient Special Instructions -----Extended recovery instructions given. Pre-Op special Istructions -----none Patient verbalized understanding of instructions that were given at this phone interview. Patient denies shortness of breath, chest pain, fever, cough at this phone interview.

## 2021-09-09 NOTE — Progress Notes (Signed)
PLEASE WEAR A MASK OUT IN PUBLIC AND SOCIAL DISTANCE AND [place] YOUR HANDS FREQUENTLY. PLEASE ASK ALL YOUR CLOSE HOUSEHOLD CONTACT TO WEAR MASK OUT IN PUBLIC AND SOCIAL DISTANCE AND [place] HANDS FREQUENTLY ALSO.      Your procedure is scheduled on Friday, 09/25/21.  Report to [facility] AT  5:30 am.   Call this number if you have problems the morning of surgery  [phone].   OUR ADDRESS IS [address].  WE ARE LOCATED IN [facility].  PLEASE BRING YOUR INSURANCE CARD AND PHOTO ID DAY OF SURGERY.  ONLY ONE PERSON ALLOWED IN FACILITY WAITING AREA.                                     REMEMBER:  DO NOT EAT FOOD, CANDY GUM OR MINTS  AFTER MIDNIGHT THE NIGHT BEFORE YOUR SURGERY . YOU MAY HAVE CLEAR LIQUIDS FROM MIDNIGHT THE NIGHT BEFORE YOUR SURGERY UNTIL  4:30 am. NO CLEAR LIQUIDS AFTER   4:30 am DAY OF SURGERY.   YOU MAY  BRUSH YOUR TEETH MORNING OF SURGERY AND RINSE YOUR MOUTH OUT, NO CHEWING GUM CANDY OR MINTS.    CLEAR LIQUID DIET   Foods Allowed                                                                     Foods Excluded  Coffee and tea, regular and decaf                             liquids that you cannot  Plain Jell-O any favor except red or purple                                           see through such as: Fruit ices (not with fruit pulp)                                     milk, soups, orange juice  Iced Popsicles                                    All solid food Carbonated beverages, regular and diet                                    Cranberry, grape and apple juices Sports drinks like Gatorade  Sample Menu Breakfast                                Lunch  Supper Cranberry juice                                           Jell-O                                     Grape juice                           Apple juice Coffee or tea                        Jell-O                                       Popsicle                                                Coffee or tea                        Coffee or tea  _____________________________________________________________________     TAKE THESE MEDICATIONS MORNING OF SURGERY WITH A SIP OF WATER:  NONE  NO ALCOHOL 24 HOURS BEFORE SURGERY.  ONE VISITOR IS ALLOWED IN WAITING ROOM ONLY DAY OF SURGERY.  YOU MAY HAVE ANOTHER PERSON SWITCH OUT WITH THE  1  VISITOR IN THE WAITING ROOM DAY OF SURGERY AND A MASK MUST BE WORN IN THE WAITING ROOM.    2 VISITORS  MAY VISIT IN THE EXTENDED RECOVERY ROOM UNTIL 800 PM ONLY 1 VISITOR AGE 63 AND OVER MAY SPEND THE NIGHT AND MUST BE IN EXTENDED RECOVERY ROOM NO LATER THAN 800 PM .    UP TO 2 CHILDREN AGE 25 TO 15 MAY ALSO VISIT IN EXTENDED RECOV ERY ROOM ONLY UNTIL 800 PM AND MUST LEAVE BY 800 PM.   ALL PERSONS VISITING IN EXTENDED RECOVERY ROOM MUST WEAR A MASK.                                    DO NOT WEAR JEWERLY, MAKE UP. DO NOT WEAR LOTIONS, POWDERS, PERFUMES OR NAIL POLISH ON YOUR FINGERNAILS. TOENAIL POLISH IS OK TO WEAR. DO NOT SHAVE FOR 48 HOURS PRIOR TO DAY OF SURGERY. MEN MAY SHAVE FACE AND NECK. CONTACTS, GLASSES, OR DENTURES MAY NOT BE WORN TO SURGERY.                                    [facility] IS NOT RESPONSIBLE  FOR ANY BELONGINGS.                                                                    Marland Kitchen  [place] [facility] - Preparing for Surgery Before surgery, you can play an important role.  Because skin is not sterile, your skin needs to be as free of germs as possible.  You can reduce the number of germs on your skin by washing with CHG (chlorahexidine gluconate) soap before surgery.  CHG is an antiseptic cleaner which kills germs and bonds with the skin to continue killing germs even after washing. Please DO NOT use if you have an allergy to CHG or antibacterial soaps.  If your skin becomes reddened/irritated stop using the CHG and inform your nurse when you arrive at [facility]. Do not shave (including legs and underarms) for at least 48 hours prior to the first CHG shower.  You may shave your face/neck. Please follow these instructions carefully:  1.  Shower with CHG Soap the night before surgery and the  morning of Surgery.  2.  If you choose to wash your hair, wash your hair first as usual with your  normal  shampoo.  3.  After you shampoo, rinse your hair and body thoroughly to remove the  shampoo.                            4.  Use CHG as you would any other liquid soap.  You can apply chg directly  to the skin and wash                      Gently with a scrungie or clean washcloth.  5.  Apply the CHG Soap to your body ONLY FROM THE NECK DOWN.   Do not use on face/ open                           Wound or open sores. Avoid contact with eyes, ears mouth and genitals (private parts).                       Wash face,  Genitals (private parts) with your normal soap.             6.  Wash thoroughly, paying special attention to the area where your surgery  will be performed.  7.  Thoroughly rinse your body with warm water from the neck down.  8.  DO NOT shower/wash with your normal soap after using and rinsing off  the CHG Soap.                9.  Pat yourself dry with a clean towel.            10.  Wear clean pajamas.            11.  Place clean sheets on your bed the night of your first shower and do not  sleep with pets. Day of Surgery : Do not apply any lotions/deodorants the morning of surgery.  Please wear clean clothes to the hospital/surgery center.  IF YOU HAVE ANY SKIN IRRITATION OR PROBLEMS WITH THE SURGICAL SOAP, PLEASE GET A BAR OF GOLD DIAL SOAP AND SHOWER THE NIGHT BEFORE YOUR SURGERY AND THE MORNING OF YOUR SURGERY. PLEASE LET THE NURSE KNOW MORNING OF YOUR SURGERY IF YOU HAD ANY PROBLEMS WITH THE SURGICAL SOAP.   ________________________________________________________________________  QUESTIONS CALL Annikah Lovins PRE OP NURSE PHONE [phone].

## 2021-09-22 ENCOUNTER — Encounter (HOSPITAL_COMMUNITY)
Admission: RE | Admit: 2021-09-22 | Discharge: 2021-09-22 | Disposition: A | Discharge status: Home / Self Care | Payer: Managed Care, Other (non HMO) | Source: Ambulatory Visit | Attending: Obstetrics and Gynecology | Admitting: Obstetrics and Gynecology

## 2021-09-22 ENCOUNTER — Other Ambulatory Visit: Payer: Self-pay

## 2021-09-22 DIAGNOSIS — Z01812 Encounter for preprocedural laboratory examination: Secondary | ICD-10-CM | POA: Insufficient documentation

## 2021-09-22 DIAGNOSIS — Z01818 Encounter for other preprocedural examination: Secondary | ICD-10-CM

## 2021-09-22 LAB — BASIC METABOLIC PANEL
Anion gap: 4 — ABNORMAL LOW (ref 5–15)
BUN: 9 mg/dL (ref 6–20)
CO2: 26 mmol/L (ref 22–32)
Calcium: 8.7 mg/dL — ABNORMAL LOW (ref 8.9–10.3)
Chloride: 106 mmol/L (ref 98–111)
Creatinine, Ser: 0.66 mg/dL (ref 0.44–1.00)
GFR, Estimated: >60 mL/min (ref >60)
Glucose, Bld: 96 mg/dL (ref 70–99)
Potassium: 4 mmol/L (ref 3.5–5.1)
Sodium: 136 mmol/L (ref 135–145)

## 2021-09-22 LAB — CBC
HCT: 40 % (ref 36.0–46.0)
Hemoglobin: 12.2 g/dL (ref 12.0–15.0)
MCH: 26.9 pg (ref 26.0–34.0)
MCHC: 30.5 g/dL (ref 30.0–36.0)
MCV: 88.1 fL (ref 80.0–100.0)
Platelets: 308 10*3/uL (ref 150–400)
RBC: 4.54 MIL/uL (ref 3.87–5.11)
RDW: 15.5 % (ref 11.5–15.5)
WBC: 4.3 10*3/uL (ref 4.0–10.5)
nRBC: 0 % (ref 0.0–0.2)

## 2021-09-25 ENCOUNTER — Encounter (HOSPITAL_BASED_OUTPATIENT_CLINIC_OR_DEPARTMENT_OTHER)
Admission: RE | Disposition: A | Discharge status: Home / Self Care | Payer: Self-pay | Source: Home / Self Care | Attending: Obstetrics and Gynecology

## 2021-09-25 ENCOUNTER — Ambulatory Visit (HOSPITAL_BASED_OUTPATIENT_CLINIC_OR_DEPARTMENT_OTHER): Payer: Managed Care, Other (non HMO) | Admitting: Anesthesiology

## 2021-09-25 ENCOUNTER — Other Ambulatory Visit: Payer: Self-pay

## 2021-09-25 ENCOUNTER — Encounter (HOSPITAL_BASED_OUTPATIENT_CLINIC_OR_DEPARTMENT_OTHER): Payer: Self-pay | Admitting: Obstetrics and Gynecology

## 2021-09-25 ENCOUNTER — Ambulatory Visit (HOSPITAL_BASED_OUTPATIENT_CLINIC_OR_DEPARTMENT_OTHER)
Admission: RE | Admit: 2021-09-25 | Discharge: 2021-09-26 | Disposition: A | Discharge status: Home / Self Care | Payer: Managed Care, Other (non HMO) | Attending: Obstetrics and Gynecology | Admitting: Obstetrics and Gynecology

## 2021-09-25 DIAGNOSIS — D259 Leiomyoma of uterus, unspecified: Secondary | ICD-10-CM | POA: Diagnosis not present

## 2021-09-25 DIAGNOSIS — Z90710 Acquired absence of both cervix and uterus: Secondary | ICD-10-CM | POA: Diagnosis present

## 2021-09-25 DIAGNOSIS — Z01818 Encounter for other preprocedural examination: Secondary | ICD-10-CM

## 2021-09-25 HISTORY — PX: CYSTOSCOPY: SHX5120

## 2021-09-25 HISTORY — DX: Anemia, unspecified: D64.9

## 2021-09-25 HISTORY — DX: Headache, unspecified: R51.9

## 2021-09-25 HISTORY — PX: ROBOTIC ASSISTED LAPAROSCOPIC HYSTERECTOMY AND SALPINGECTOMY: SHX6379

## 2021-09-25 LAB — ABO/RH: ABO/RH(D): A POS

## 2021-09-25 LAB — TYPE AND SCREEN
ABO/RH(D): A POS
Antibody Screen: NEGATIVE

## 2021-09-25 LAB — POCT PREGNANCY, URINE: Preg Test, Ur: NEGATIVE

## 2021-09-25 SURGERY — XI ROBOTIC ASSISTED LAPAROSCOPIC HYSTERECTOMY AND SALPINGECTOMY
Anesthesia: General | Site: Urethra

## 2021-09-25 MED ORDER — PANTOPRAZOLE SODIUM 40 MG PO TBEC
DELAYED_RELEASE_TABLET | ORAL | Status: AC
  Filled 2021-09-25: qty 1

## 2021-09-25 MED ORDER — SCOPOLAMINE 1 MG/3DAYS TD PT72
MEDICATED_PATCH | TRANSDERMAL | Status: AC
  Filled 2021-09-25: qty 1

## 2021-09-25 MED ORDER — CEFAZOLIN SODIUM 1 G IJ SOLR
INTRAMUSCULAR | Status: AC
  Filled 2021-09-25: qty 20

## 2021-09-25 MED ORDER — PROPOFOL 10 MG/ML IV BOLUS
INTRAVENOUS | Status: AC
  Filled 2021-09-25: qty 20

## 2021-09-25 MED ORDER — MIDAZOLAM HCL 2 MG/2ML IJ SOLN
INTRAMUSCULAR | Status: AC
  Filled 2021-09-25: qty 2

## 2021-09-25 MED ORDER — PROPOFOL 10 MG/ML IV BOLUS
INTRAVENOUS | Status: DC | PRN
Start: 2021-09-25 — End: 2021-09-25
  Administered 2021-09-25: 140 mg via INTRAVENOUS

## 2021-09-25 MED ORDER — BUPIVACAINE HCL (PF) 0.25 % IJ SOLN
INTRAMUSCULAR | Status: DC | PRN
  Administered 2021-09-25: 14 mL

## 2021-09-25 MED ORDER — MISOPROSTOL 200 MCG PO TABS: 200.0000 ug | ORAL_TABLET | Freq: Once | ORAL | Status: DC

## 2021-09-25 MED ORDER — DEXTROSE IN LACTATED RINGERS 5 % IV SOLN: INTRAVENOUS | Status: DC

## 2021-09-25 MED ORDER — SUGAMMADEX SODIUM 200 MG/2ML IV SOLN
INTRAVENOUS | Status: DC | PRN
  Administered 2021-09-25: 150 mg via INTRAVENOUS

## 2021-09-25 MED ORDER — DROPERIDOL 2.5 MG/ML IJ SOLN
INTRAMUSCULAR | Status: DC | PRN
  Administered 2021-09-25: .625 mg via INTRAVENOUS

## 2021-09-25 MED ORDER — ONDANSETRON HCL 4 MG PO TABS: 4.0000 mg | ORAL_TABLET | Freq: Four times a day (QID) | ORAL | Status: DC | PRN

## 2021-09-25 MED ORDER — CEFAZOLIN SODIUM-DEXTROSE 2-4 GM/100ML-% IV SOLN
INTRAVENOUS | Status: AC
Start: 2021-09-25 — End: ?
  Filled 2021-09-25: qty 100

## 2021-09-25 MED ORDER — CEFAZOLIN SODIUM-DEXTROSE 2-4 GM/100ML-% IV SOLN
2.0000 g | INTRAVENOUS | Status: AC
  Administered 2021-09-25 (×2): 2 g via INTRAVENOUS

## 2021-09-25 MED ORDER — HYDROMORPHONE HCL 2 MG/ML IJ SOLN
INTRAMUSCULAR | Status: AC
  Filled 2021-09-25: qty 1

## 2021-09-25 MED ORDER — OXYCODONE HCL 5 MG PO TABS
ORAL_TABLET | ORAL | Status: AC
  Filled 2021-09-25: qty 1

## 2021-09-25 MED ORDER — KETOROLAC TROMETHAMINE 30 MG/ML IJ SOLN: 30.0000 mg | Freq: Once | INTRAMUSCULAR | Status: DC | PRN

## 2021-09-25 MED ORDER — IBUPROFEN 200 MG PO TABS
ORAL_TABLET | ORAL | Status: AC
  Filled 2021-09-25: qty 3

## 2021-09-25 MED ORDER — SODIUM CHLORIDE (PF) 0.9 % IJ SOLN
INTRAMUSCULAR | Status: AC
  Filled 2021-09-25: qty 10

## 2021-09-25 MED ORDER — PHENYLEPHRINE 40 MCG/ML (10ML) SYRINGE FOR IV PUSH (FOR BLOOD PRESSURE SUPPORT)
PREFILLED_SYRINGE | INTRAVENOUS | Status: AC
  Filled 2021-09-25: qty 10

## 2021-09-25 MED ORDER — ACETAMINOPHEN 500 MG PO TABS
ORAL_TABLET | ORAL | Status: AC
  Filled 2021-09-25: qty 2

## 2021-09-25 MED ORDER — ONDANSETRON HCL 4 MG/2ML IJ SOLN
INTRAMUSCULAR | Status: DC | PRN
  Administered 2021-09-25: 4 mg via INTRAVENOUS

## 2021-09-25 MED ORDER — MIDAZOLAM HCL 5 MG/5ML IJ SOLN
INTRAMUSCULAR | Status: DC | PRN
  Administered 2021-09-25: 2 mg via INTRAVENOUS

## 2021-09-25 MED ORDER — ACETAMINOPHEN 500 MG PO TABS
1000.0000 mg | ORAL_TABLET | Freq: Four times a day (QID) | ORAL | Status: DC
  Administered 2021-09-25 – 2021-09-26 (×3): 1000 mg via ORAL

## 2021-09-25 MED ORDER — POVIDONE-IODINE 10 % EX SWAB: 2.0000 "application " | Freq: Once | CUTANEOUS | Status: DC

## 2021-09-25 MED ORDER — IBUPROFEN 200 MG PO TABS
600.0000 mg | ORAL_TABLET | Freq: Four times a day (QID) | ORAL | Status: DC
  Administered 2021-09-25 – 2021-09-26 (×3): 600 mg via ORAL

## 2021-09-25 MED ORDER — ONDANSETRON HCL 4 MG/2ML IJ SOLN: 4.0000 mg | Freq: Once | INTRAMUSCULAR | Status: DC | PRN

## 2021-09-25 MED ORDER — ONDANSETRON HCL 4 MG/2ML IJ SOLN
4.0000 mg | Freq: Four times a day (QID) | INTRAMUSCULAR | Status: DC | PRN
  Administered 2021-09-26: 4 mg via INTRAVENOUS

## 2021-09-25 MED ORDER — ROCURONIUM BROMIDE 10 MG/ML (PF) SYRINGE
PREFILLED_SYRINGE | INTRAVENOUS | Status: AC
  Filled 2021-09-25: qty 10

## 2021-09-25 MED ORDER — FENTANYL CITRATE (PF) 250 MCG/5ML IJ SOLN
INTRAMUSCULAR | Status: AC
  Filled 2021-09-25: qty 5

## 2021-09-25 MED ORDER — HYDROMORPHONE HCL 1 MG/ML IJ SOLN
2.0000 mg | Freq: Once | INTRAMUSCULAR | Status: AC
  Administered 2021-09-25: 1 mg via INTRAVENOUS

## 2021-09-25 MED ORDER — ROCURONIUM BROMIDE 100 MG/10ML IV SOLN
INTRAVENOUS | Status: DC | PRN
  Administered 2021-09-25: 5 mg via INTRAVENOUS
  Administered 2021-09-25: 10 mg via INTRAVENOUS
  Administered 2021-09-25: 20 mg via INTRAVENOUS
  Administered 2021-09-25: 5 mg via INTRAVENOUS
  Administered 2021-09-25: 10 mg via INTRAVENOUS
  Administered 2021-09-25: 70 mg via INTRAVENOUS
  Administered 2021-09-25: 5 mg via INTRAVENOUS
  Administered 2021-09-25: 10 mg via INTRAVENOUS

## 2021-09-25 MED ORDER — SODIUM CHLORIDE 0.9 % IR SOLN
Status: DC | PRN
  Administered 2021-09-25 (×3): 1000 mL

## 2021-09-25 MED ORDER — PANTOPRAZOLE SODIUM 40 MG PO TBEC
40.0000 mg | DELAYED_RELEASE_TABLET | Freq: Every day | ORAL | Status: DC
  Administered 2021-09-25: 40 mg via ORAL

## 2021-09-25 MED ORDER — LACTATED RINGERS IV SOLN: INTRAVENOUS | Status: DC

## 2021-09-25 MED ORDER — LIDOCAINE 2% (20 MG/ML) 5 ML SYRINGE
INTRAMUSCULAR | Status: DC | PRN
Start: 2021-09-25 — End: 2021-09-25
  Administered 2021-09-25: 100 mg via INTRAVENOUS

## 2021-09-25 MED ORDER — DEXAMETHASONE SODIUM PHOSPHATE 10 MG/ML IJ SOLN
INTRAMUSCULAR | Status: DC | PRN
  Administered 2021-09-25: 10 mg via INTRAVENOUS

## 2021-09-25 MED ORDER — FLUORESCEIN SODIUM 10 % IV SOLN
INTRAVENOUS | Status: DC | PRN
  Administered 2021-09-25: 1 mL via INTRAVENOUS

## 2021-09-25 MED ORDER — STERILE WATER FOR IRRIGATION IR SOLN
Status: DC | PRN
  Administered 2021-09-25: 500 mL

## 2021-09-25 MED ORDER — HYDROMORPHONE HCL 1 MG/ML IJ SOLN
INTRAMUSCULAR | Status: AC
  Filled 2021-09-25: qty 2

## 2021-09-25 MED ORDER — HYDROMORPHONE HCL 1 MG/ML IJ SOLN: 0.2500 mg | INTRAMUSCULAR | Status: DC | PRN

## 2021-09-25 MED ORDER — OXYCODONE HCL 5 MG PO TABS: 5.0000 mg | ORAL_TABLET | Freq: Once | ORAL | Status: DC | PRN

## 2021-09-25 MED ORDER — LIDOCAINE HCL (PF) 2 % IJ SOLN
INTRAMUSCULAR | Status: AC
  Filled 2021-09-25: qty 5

## 2021-09-25 MED ORDER — FENTANYL CITRATE (PF) 100 MCG/2ML IJ SOLN
INTRAMUSCULAR | Status: DC | PRN
Start: 2021-09-25 — End: 2021-09-25
  Administered 2021-09-25 (×3): 50 ug via INTRAVENOUS

## 2021-09-25 MED ORDER — MENTHOL 3 MG MT LOZG: 1.0000 | LOZENGE | OROMUCOSAL | Status: DC | PRN

## 2021-09-25 MED ORDER — OXYCODONE HCL 5 MG PO TABS
5.0000 mg | ORAL_TABLET | ORAL | Status: DC | PRN
  Administered 2021-09-25: 5 mg via ORAL
  Administered 2021-09-26 (×2): 10 mg via ORAL

## 2021-09-25 MED ORDER — PHENYLEPHRINE 40 MCG/ML (10ML) SYRINGE FOR IV PUSH (FOR BLOOD PRESSURE SUPPORT)
PREFILLED_SYRINGE | INTRAVENOUS | Status: DC | PRN
  Administered 2021-09-25: 80 ug via INTRAVENOUS

## 2021-09-25 MED ORDER — HYDROMORPHONE HCL 1 MG/ML IJ SOLN
INTRAMUSCULAR | Status: DC | PRN
Start: 2021-09-25 — End: 2021-09-25
  Administered 2021-09-25 (×4): .5 mg via INTRAVENOUS

## 2021-09-25 MED ORDER — HEMOSTATIC AGENTS (NO CHARGE) OPTIME
TOPICAL | Status: DC | PRN
  Administered 2021-09-25: 1

## 2021-09-25 MED ORDER — SIMETHICONE 80 MG PO CHEW: 80.0000 mg | CHEWABLE_TABLET | Freq: Four times a day (QID) | ORAL | Status: DC | PRN

## 2021-09-25 MED ORDER — OXYCODONE HCL 5 MG/5ML PO SOLN: 5.0000 mg | Freq: Once | ORAL | Status: DC | PRN

## 2021-09-25 SURGICAL SUPPLY — 68 items
ADH SKN CLS APL DERMABOND .7 (GAUZE/BANDAGES/DRESSINGS) ×2
APL SRG 38 LTWT LNG FL B (MISCELLANEOUS) ×2
APPLICATOR ARISTA FLEXITIP XL (MISCELLANEOUS) ×1 IMPLANT
CATH FOLEY 3WAY  5CC 16FR (CATHETERS) ×3
CATH FOLEY 3WAY 5CC 16FR (CATHETERS) ×2 IMPLANT
COVER BACK TABLE 60X90IN (DRAPES) ×3 IMPLANT
COVER TIP SHEARS 8 DVNC (MISCELLANEOUS) ×2 IMPLANT
COVER TIP SHEARS 8MM DA VINCI (MISCELLANEOUS) ×3
DECANTER SPIKE VIAL GLASS SM (MISCELLANEOUS) ×3 IMPLANT
DEFOGGER SCOPE WARMER CLEARIFY (MISCELLANEOUS) ×3 IMPLANT
DERMABOND ADVANCED (GAUZE/BANDAGES/DRESSINGS) ×1
DERMABOND ADVANCED .7 DNX12 (GAUZE/BANDAGES/DRESSINGS) ×2 IMPLANT
DRAPE ARM DVNC X/XI (DISPOSABLE) ×8 IMPLANT
DRAPE COLUMN DVNC XI (DISPOSABLE) ×2 IMPLANT
DRAPE DA VINCI XI ARM (DISPOSABLE) ×12
DRAPE DA VINCI XI COLUMN (DISPOSABLE) ×3
DRAPE SHEET LG 3/4 BI-LAMINATE (DRAPES) ×1 IMPLANT
DRAPE UTILITY XL STRL (DRAPES) ×3 IMPLANT
DURAPREP 26ML APPLICATOR (WOUND CARE) ×3 IMPLANT
ELECT REM PT RETURN 9FT ADLT (ELECTROSURGICAL) ×3
ELECTRODE REM PT RTRN 9FT ADLT (ELECTROSURGICAL) ×2 IMPLANT
GAUZE 4X4 16PLY ~~LOC~~+RFID DBL (SPONGE) ×6 IMPLANT
GLOVE SURG ENC MOIS LTX SZ7.5 (GLOVE) ×1 IMPLANT
GLOVE SURG LTX SZ6.5 (GLOVE) ×9 IMPLANT
GLOVE SURG POLYISO LF SZ7 (GLOVE) ×5 IMPLANT
GLOVE SURG POLYISO LF SZ7.5 (GLOVE) ×1 IMPLANT
GLOVE SURG UNDER POLY LF SZ7 (GLOVE) ×17 IMPLANT
GOWN STRL REUS W/TWL LRG LVL3 (GOWN DISPOSABLE) ×4 IMPLANT
HEMOSTAT ARISTA ABSORB 3G PWDR (HEMOSTASIS) ×1 IMPLANT
HIBICLENS CHG 4% 4OZ BTL (MISCELLANEOUS) ×1 IMPLANT
HOLDER FOLEY CATH W/STRAP (MISCELLANEOUS) ×1 IMPLANT
IRRIG SUCT STRYKERFLOW 2 WTIP (MISCELLANEOUS) ×3
IRRIGATION SUCT STRKRFLW 2 WTP (MISCELLANEOUS) ×2 IMPLANT
IV NS 1000ML (IV SOLUTION) ×9
IV NS 1000ML BAXH (IV SOLUTION) IMPLANT
KIT TURNOVER CYSTO (KITS) ×3 IMPLANT
LEGGING LITHOTOMY PAIR STRL (DRAPES) ×3 IMPLANT
LUBRICANT JELLY K Y 4OZ (MISCELLANEOUS) ×1 IMPLANT
NEEDLE INSUFFLATION 120MM (ENDOMECHANICALS) ×3 IMPLANT
OBTURATOR OPTICAL STANDARD 8MM (TROCAR) ×3
OBTURATOR OPTICAL STND 8 DVNC (TROCAR) ×2
OBTURATOR OPTICALSTD 8 DVNC (TROCAR) ×2 IMPLANT
OCCLUDER COLPOPNEUMO (BALLOONS) ×3 IMPLANT
PACK ROBOT WH (CUSTOM PROCEDURE TRAY) ×3 IMPLANT
PACK ROBOTIC GOWN (GOWN DISPOSABLE) ×3 IMPLANT
PACK TRENDGUARD 450 HYBRID PRO (MISCELLANEOUS) ×2 IMPLANT
PAD OB MATERNITY 4.3X12.25 (PERSONAL CARE ITEMS) ×3 IMPLANT
PAD PREP 24X48 CUFFED NSTRL (MISCELLANEOUS) ×3 IMPLANT
PROTECTOR NERVE ULNAR (MISCELLANEOUS) ×6 IMPLANT
SEAL CANN UNIV 5-8 DVNC XI (MISCELLANEOUS) ×8 IMPLANT
SEAL XI 5MM-8MM UNIVERSAL (MISCELLANEOUS) ×12
SEALER VESSEL DA VINCI XI (MISCELLANEOUS) ×3
SEALER VESSEL EXT DVNC XI (MISCELLANEOUS) ×2 IMPLANT
SET IRRIG Y TYPE TUR BLADDER L (SET/KITS/TRAYS/PACK) ×1 IMPLANT
SET TRI-LUMEN FLTR TB AIRSEAL (TUBING) ×3 IMPLANT
SOL PREP POV-IOD 4OZ 10% (MISCELLANEOUS) ×1 IMPLANT
SUT PLAIN 2 0 (SUTURE) ×3
SUT PLAIN ABS 2-0 CT1 27XMFL (SUTURE) IMPLANT
SUT VIC AB 0 CT1 36 (SUTURE) ×6 IMPLANT
SUT VIC AB 4-0 PS2 18 (SUTURE) ×6 IMPLANT
SUT VICRYL RAPIDE 4/0 PS 2 (SUTURE) ×1 IMPLANT
SUT VLOC 180 0 9IN  GS21 (SUTURE) ×6
SUT VLOC 180 0 9IN GS21 (SUTURE) ×2 IMPLANT
TIP UTERINE 6.7X10CM GRN DISP (MISCELLANEOUS) ×1 IMPLANT
TOWEL OR 17X26 10 PK STRL BLUE (TOWEL DISPOSABLE) ×5 IMPLANT
TRENDGUARD 450 HYBRID PRO PACK (MISCELLANEOUS) ×3
TROCAR PORT AIRSEAL 8X120 (TROCAR) ×3 IMPLANT
WATER STERILE IRR 500ML POUR (IV SOLUTION) ×1 IMPLANT

## 2021-09-25 NOTE — Transfer of Care (Signed)
Immediate Anesthesia Transfer of Care Note  Patient: [name]  Procedure(s) Performed: XI ROBOTIC ASSISTED LAPAROSCOPIC HYSTERECTOMY AND SALPINGECTOMY (Abdomen) CYSTOSCOPY (Urethra)  Patient Location: PACU  Anesthesia Type:General  Level of Consciousness: drowsy  Airway & Oxygen Therapy: Patient Spontanous Breathing and Patient connected to face mask oxygen  Post-op Assessment: Report given to RN and Post -op Vital signs reviewed and stable  Post vital signs: Reviewed and stable  Last Vitals:  Vitals Value Taken Time  BP 107/84 09/25/21 1251  Temp    Pulse 77 09/25/21 1256  Resp 11 09/25/21 1256  SpO2 100 % 09/25/21 1256  Vitals shown include unvalidated device data.  Last Pain:  Vitals:   09/25/21 0622  TempSrc: Oral  PainSc: 2       Patients Stated Pain Goal: 2 (42/55/25 8948)  Complications: No notable events documented.

## 2021-09-25 NOTE — Anesthesia Procedure Notes (Signed)
Procedure Name: Intubation Date/Time: 09/25/2021 7:53 AM Performed by: Gwyndolyn Saxon, CRNA Pre-anesthesia Checklist: Patient identified, Emergency Drugs available, Suction available and Patient being monitored Patient Re-evaluated:Patient Re-evaluated prior to induction Oxygen Delivery Method: Circle system utilized Preoxygenation: Pre-oxygenation with 100% oxygen Induction Type: IV induction Ventilation: Mask ventilation without difficulty Laryngoscope Size: Miller and 2 Grade View: Grade II Tube type: Oral Tube size: 7.0 mm Number of attempts: 1 Airway Equipment and Method: Patient positioned with wedge pillow and Stylet Placement Confirmation: ETT inserted through vocal cords under direct vision, positive ETCO2 and breath sounds checked- equal and bilateral Secured at: 21 cm Tube secured with: Tape Dental Injury: Teeth and Oropharynx as per pre-operative assessment

## 2021-09-25 NOTE — Anesthesia Preprocedure Evaluation (Signed)
Anesthesia Evaluation  Patient identified by MRN, date of birth, ID band Patient awake    Reviewed: Allergy & Precautions, NPO status , Patient's Chart, lab work & pertinent test results  Airway Mallampati: II  TM Distance: >3 FB Neck ROM: Full    Dental no notable dental hx.    Pulmonary neg pulmonary ROS,    Pulmonary exam normal breath sounds clear to auscultation       Cardiovascular negative cardio ROS Normal cardiovascular exam Rhythm:Regular Rate:Normal     Neuro/Psych negative neurological ROS  negative psych ROS   GI/Hepatic negative GI ROS, Neg liver ROS,   Endo/Other  negative endocrine ROS  Renal/GU negative Renal ROS  negative genitourinary   Musculoskeletal negative musculoskeletal ROS (+)   Abdominal   Peds negative pediatric ROS (+)  Hematology negative hematology ROS (+)   Anesthesia Other Findings   Reproductive/Obstetrics negative OB ROS                             Anesthesia Physical Anesthesia Plan  ASA: 1  Anesthesia Plan: General   Post-op Pain Management: Dilaudid IV   Induction: Intravenous  PONV Risk Score and Plan: 3 and Ondansetron, Dexamethasone, Droperidol, Midazolam and Treatment may vary due to age or medical condition  Airway Management Planned: Oral ETT  Additional Equipment:   Intra-op Plan:   Post-operative Plan: Extubation in OR  Informed Consent: I have reviewed the patients History and Physical, chart, labs and discussed the procedure including the risks, benefits and alternatives for the proposed anesthesia with the patient or authorized representative who has indicated his/her understanding and acceptance.     Dental advisory given  Plan Discussed with: CRNA and Surgeon  Anesthesia Plan Comments:         Anesthesia Quick Evaluation

## 2021-09-25 NOTE — Op Note (Signed)
NAMEKRISI, [name] MEDICAL RECORD NO: [id number] ACCOUNT NO: [id number] DATE OF BIRTH: November 08, 1970 FACILITY: [facility] LOCATION: [facility] PHYSICIAN: Faizah Kandler A. Garwin Brothers, MD  Operative Report   DATE OF PROCEDURE: 09/25/2021   PREOPERATIVE DIAGNOSIS:  Symptomatic uterine fibroids.  PROCEDURE:  Da Vinci robotic total hysterectomy, bilateral salpingectomy, cystoscopy.  POSTOPERATIVE DIAGNOSIS:  Symptomatic uterine fibroids.   ANESTHESIA:  General.  SURGEON:  Mace Weinberg A. Garwin Brothers, MD.  ASSISTANT:  Gaylord Shih, RNFA.  DESCRIPTION OF PROCEDURE:  Under adequate general anesthesia, the patient was placed in a dorsal lithotomy position.  She was positioned for robotic surgery.  Examination under anesthesia revealed a mobile uterus, extended to the umbilicus with a  palpable fundal fibroid.  The patient was then sterilely prepped and draped in the usual fashion.  A 3-way Foley catheter was sterilely placed.  Weighted speculum was placed in the vagina.  Sims retractor was placed anteriorly.  Cervix was parous.  The 0  Vicryl figure-of-eight suture was placed on the anterior and posterior lip of the cervix.  The uterus sounded to 4.5 inches, which was thought to be equivalent tothe #10 uterine manipulator. A #10 manipulator with a medium Rumi cup was then introduced into  the uterine cavity without incident.  The retractors were removed.  Attention was then turned to the abdomen.  Based on the location of the  fundal fibroid, a 0.25% Marcaine was injected at least an inch above the umbilicus and a vertical  skin incision  was  then made.  Veress needle was introduced  and tested. An opening pressure of 5 was noted, 3.3 liters of CO2 was insufflated.  Veress needle was then removed.  An 8 mm robotic trocar was introduced into the abdomen without incident.  The lighted robotic camera port  was then inserted and entry into the abdomen without incident was noted. On entering the abdomen, panoramic  inspection was done.  No evidence of trauma or complications with the entry was noted.  Normal liver edge was noted.  The uterus was notable for a  large fundal fibroid.  The patient was placed in the Trendelenburg position and the manipulation of the uterus.  The additional port sites were then placed on the left 20 cm from the camera port site, laterally was the first port placement after 0.25%  Marcaine was injected an 8 mm port in between that and the robotic camera port site and slightly superior was a #8 AirSeal trocar was then placed.  On the opposite side 2 additional robotic ports were placed about 9 cm apart under direct visualization as  well.  Once this was done, the pelvis was further inspected.  There were some filmy adhesions in the posterior cul-de-sac.  There was evidence of surgical separation of the left tube with the large left lateral subserosal fibroid in the lower uterine  segment, which stretched underneath the left round ligament as well as the left tube and ovary.  There was a pedunculated fibroid posterior and lateral in addition to the fundal fibroid. On the opposite side was additional fibroid.  Both ovaries were  normal.  The right tube distal end was seen separated as well. The robot was then docked.  The vessel sealer was placed in arm #1.  In arm #3 was a long bipolar cautery. Arm #4 site was the monopolar scissors. I then went to the surgical console.  At the surgical console inspection was again performed.  The procedure was started on the left side with grasping  the separated remaining fallopian tube and underlying mesosalpinx being opened serially with  cautery and removed. The left retroperitoneal space was then opened as well and the medial leaf of the broad ligament was opened with the scissors.  The left uteroovarian ligament was then serially clamped, cauterized and then cut.  That left the lateral  fibroid being noted.  The left round ligament was identified. As  stated previously stretched over a fibroid. The mid portion of the ligament was clamped, cauterized and cut.  Once that was done, the anterior leaf of the broad ligament was opened anteriorly as well as  posteriorly extending over the fibroid, which allowed for the uterine vessels underneath that to be seen clearly.  The vesicouterine peritoneum opening was then  carried around anteriorly and the bladder was then sharply dissected off the RUMI cup anteriorly and  displaced inferiorly.  The uterine vessels on the left were then skeletonized, serially clamped, cauterized, initially cut and then additional clamp, cauterized, but not cut.  The procedure was then turned to the opposite side where the fallopian tube  was again identified.  To facilitate that portion of the surgery due to the large fundal fibroid, a robotic tenaculum was placed where the vessel sealer used to be in #1.  This was able along with a single tooth tenaculum as well was placed through the  AirSeal and the traction was then placed to pull the uterus deviated to the left.  With that being done, the vessel sealer replaced the monopolar scissors initially. The scissors was then put back in to open up the retroperitoneal space on the right.   Once this was done, the vessel sealer was then used to replace the scissors and utilized to then cauterize and cut the right uteroovarian ligament.  The vesicouterine peritoneum was continued further anteriorly.  The round ligament on the right was very  attenuated.  It was subsequently clamped, cauterized and cut.  That right side vessels were then turned medially, but they were located.  The peritoneum was skeletonized around them and the bladder was further placed inferiorly.  The vessels were then  clamped, cauterized and then cut.  At that point, the vagina was insufflated.  The cervicovaginal junction at the upper part of the RUMI cup was identified and circumferentially the cervix was detached from  the vagina after much manipulation.  Once that  was done, decision was then made regarding how to remove the specimen.  The vessel sealer was removed.  Two robotic tenaculum was then placed on either side.  The robotic hook was then used to split the big fibroid fundally down the midline and  anteriorly enucleated another 8 cm fibroid, separated to decompress the specimen.  Once that was done and the slight large fibroid had been split in the midline and piece of it was separated from the specimen, the bigger specimen was then able to be  removed through the vagina followed by the remaining 5 piece of the fibroid and the other larger fibroid had been enucleated as well as the small pedunculated fibroid, which had been separated.  All specimen at that point was removed. The bladder was  further displaced inferiorly.  There was a pumping bleeding vessel on the left  was noted for which necessitated the use of the bipolar grasper.  The bipolar grasper on that side was used for cauterization and hemostasis. It had been pumping it for a short time  in the surgery as the unexplained  pooled blood was noted on right after the specimen has been enucleated.  Nonetheless, once that was done, the bleeding on the vaginal cuff circumferentially were identified.  Small bleeders were cauterized.  Due  to the location of the bleeding on the left, the decision intraoperatively was made for cystoscopy to evaluate the ureter in particular on the left side.  The vaginal cuff was subsequently closed using running 0 V-Loc x2 after replacing the  instruments with a long tip forceps as well as a large suture needle driver.  Once the cuff was closed, I left the console and performed the 70-degree angle cystoscopy after removing the Foley.  The intravenous dye was given, brisk ejection of urine was seen  from both ureters, the cystoscope was removed.  The Foley was then reinserted sterilely and I went back to the surgical console.  At  that point, the adnexa was inspected.  Wherever the bleeding had been clot, the fluid irrigation that had been noted in the right  upper quadrant over the liver, it was thought best to then undock and then deal with evacuation of the fluid in the abdomen by bedside.  At that point, the robotic instruments were removed.  The robot was undocked and I went back to the patient's bedside  sterilely.  The blood and clots were evacuated from the upper abdomen.  The patient was placed in Trendelenburg position.  Hemostasis that we saw at the vaginal cuff was no longer present and bleeding was noted.  The abdomen was irrigated, suctioned,  bleeding was still noted on both lateral side of the cuff. At that point, the decision was then made to re-dock and put in limited robotic instruments.  I went back to the surgical console and addressed the bleeding with careful cauterization.  When hemostasis  was felt to have been achieved, the vaginal cuff was then inspected.  The Arista potato starch was placed over the vaginal cuff.  The robot was undocked.  The robot instruments have been removed and I went back to the patient's bedside again with the  abdomen deflated, the ports removed and incisions closed with 4-0 Vicryl subcuticular closure, the vaginal cuff was inspected visually and  palpated was  well approximated and at that point the procedure was felt to be complete.  SPECIMEN:  Uterus with cervix , both fallopian tubes sent to pathology.  The specimen weighed 938 grams.  The intraoperative fluids was 1500 mL. Urine output was  300  clear yellow urine.  Estimated blood loss was 350 mL. Complication was none.  The patient  tolerated the procedure well and was transferred to the recovery room in stable condition.      SUJ D: 09/25/2021 3:08:16 pm T: 09/25/2021 10:18:00 pm  JOB: [id number]/ [id number]

## 2021-09-25 NOTE — Interval H&P Note (Signed)
History and Physical Interval Note:  09/25/2021 7:41 AM  [name]  has presented today for surgery, with the diagnosis of symptomatic uterine fibroids.  The various methods of treatment have been discussed with the patient and family. After consideration of risks, benefits and other options for treatment, the patient has consented to  Procedure(s): XI ROBOTIC ASSISTED LAPAROSCOPIC HYSTERECTOMY AND SALPINGECTOMY (N/A) as a surgical intervention.  The patient's history has been reviewed, patient examined, no change in status, stable for surgery.  I have reviewed the patient's chart and labs.  Questions were answered to the patient's satisfaction.     Shammond Arave A Nene Aranas

## 2021-09-25 NOTE — Anesthesia Postprocedure Evaluation (Signed)
Anesthesia Post Note  Patient: [name]  Procedure(s) Performed: XI ROBOTIC ASSISTED LAPAROSCOPIC HYSTERECTOMY AND SALPINGECTOMY (Abdomen) CYSTOSCOPY (Urethra)     Patient location during evaluation: PACU Anesthesia Type: General Level of consciousness: awake and alert Pain management: pain level controlled Vital Signs Assessment: post-procedure vital signs reviewed and stable Respiratory status: spontaneous breathing, nonlabored ventilation, respiratory function stable and patient connected to nasal cannula oxygen Cardiovascular status: blood pressure returned to baseline and stable Postop Assessment: no apparent nausea or vomiting Anesthetic complications: no   No notable events documented.  Last Vitals:  Vitals:   09/25/21 1330 09/25/21 1345  BP: 114/83 110/85  Pulse: (!) 56 (!) 57  Resp: (!) 8 10  Temp:  (!) 36.3 C  SpO2: 100% 100%    Last Pain:  Vitals:   09/25/21 1315  TempSrc:   PainSc: Asleep                 Gregoire Bennis S

## 2021-09-25 NOTE — Brief Op Note (Signed)
09/25/2021  1:24 PM  PATIENT:  [name]  51 y.o. female  PRE-OPERATIVE DIAGNOSIS:  symptomatic uterine fibroids  POST-OPERATIVE DIAGNOSIS:  symptomatic uterine fibroids  PROCEDURE:  Davinci robotic total hysterectomy, bilateral salpingectomy, cystoscopy  SURGEON:  Surgeon(s) and Role:    * Servando Salina, MD - Primary  PHYSICIAN ASSISTANT:   ASSISTANTS: Gaylord Shih RNFA   ANESTHESIA:   general  EBL:  350 mL  Findings : nl ovaries, surgical separated tubes. Uterus 16 wk size with large fundal SS/IM  LUS right lateral fibroid, pedunculated left fibroid, posterior cul de sac thin adhesions, partial view of appendix Nl liver edge. Ureters peristalsing bilaterally and jets ( ureteral jets) BLOOD ADMINISTERED:none  DRAINS: none   LOCAL MEDICATIONS USED:  MARCAINE     SPECIMEN:  Source of Specimen:  morcellated uterus with cervix , tubes  DISPOSITION OF SPECIMEN:  PATHOLOGY  COUNTS:  YES  TOURNIQUET:  * No tourniquets in log *  DICTATION: .Other Dictation: Dictation Number [id number]  PLAN OF CARE: Admit for overnight observation  PATIENT DISPOSITION:  PACU - hemodynamically stable.   Delay start of Pharmacological VTE agent (>24hrs) due to surgical blood loss or risk of bleeding: no

## 2021-09-25 NOTE — H&P (Signed)
[name] is an 51 y.o. female. [id number] BF with symptomatic uterine fibroids presents for surgical management. Hx TL. Pt has heavy cycles. Declined [place], lupron injections. Pt is scheduled for davinci robotic total hysterectomy, bilateral salpingectomy  Pertinent Gynecological History: Menses: flow is excessive with use of 5 pads or tampons on heaviest days Bleeding: menorrhagia Contraception: tubal ligation DES exposure: denies Blood transfusions: none Sexually transmitted diseases: no past history Previous GYN Procedures:  TL, D&E   Last mammogram: normal Date: 2022 Last pap: normal  (+) HPVDate: 2022 OB History: G6, P3   Menstrual History: Menarche age: n/a Patient's last menstrual period was 08/24/2021 (exact date).    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   Hemorrhoids    Insomnia    Left breast mass    Palpitations 07/2021   Pt saw cardiologist, Dr.Sunit Tolia with [facility]. Per Dr. Terri Skains 's OV note from 07/22/21, palpitations were likely due to iron deficiency anemia. EKG was normal. A 7 day heart monitor was ordered but patient never completed test.Per pt, she has not had any palpitations in February 2023.   Simple ovarian cyst     Past Surgical History:  Procedure Laterality Date   BREAST CYST EXCISION Right 2005   COLONOSCOPY WITH ESOPHAGOGASTRODUODENOSCOPY (EGD)  07/19/2013   KNEE SURGERY Left 1986   TUBAL LIGATION  2006    Family History  Problem Relation Age of Onset   Diabetes Mother    Alcohol abuse Mother    Colon polyps Mother    Dementia Mother 63   Dementia Father    Breast cancer Sister     Hypertension Sister    Sarcoidosis Sister    Colon polyps Sister    Diabetes Maternal Grandmother    Aneurysm Maternal Grandmother    Cancer Paternal Grandmother        type unknown   Breast cancer Cousin     Social History:  reports that she has never smoked. She has never used smokeless tobacco. She reports current alcohol use of about 3.0 standard drinks per week. She reports that she does not use drugs.  Allergies: No Known Allergies  Medications Prior to Admission  Medication Sig Dispense Refill Last Dose   Cholecalciferol (VITAMIN D-3) 25 MCG (1000 UT) CAPS Take by mouth.   09/23/2021   ferrous sulfate 325 (65 FE) MG tablet Take 325 mg by mouth daily.   09/23/2021    Review of Systems  All other systems reviewed and are negative.  Height 5\' 6"  (1.676 m), weight 76.2 kg, last menstrual period 08/24/2021. Physical Exam Constitutional:      Appearance: Normal appearance.  HENT:     Head: Atraumatic.  Eyes:     Extraocular Movements: Extraocular movements intact.  Cardiovascular:     Rate and Rhythm: Regular rhythm.     Heart sounds: Normal heart sounds.  Pulmonary:     Breath sounds: Normal breath sounds.  Abdominal:     Palpations: Abdomen is soft.     Comments: Soft nontender palp mobile mass  4 FB above symphysis pubis  Genitourinary:    General: Normal vulva.     Comments: Vagina nl Cervix parous Uterus mobile 15-16 wk size Adnexa non palp Musculoskeletal:        General: Normal range of motion.     Cervical back: Neck supple.  Skin:    General: Skin is warm and dry.  Neurological:     General: No focal deficit present.     Mental Status: She is alert and oriented to person, place, and time.  Psychiatric:        Mood and Affect: Mood normal.        Behavior: Behavior normal.    Results for orders placed or performed during the hospital encounter of 09/25/21 (from the past 24 hour(s))  Pregnancy, urine POC     Status: None   Collection Time: 09/25/21   5:59 AM  Result Value Ref Range   Preg Test, Ur NEGATIVE NEGATIVE    No results found.  Assessment/Plan: IMP: Symptomatic uterine fibroid P) DaVinci robotic total hysterectomy, bilateral salpingectomy Procedure explained. Risk of surgery reviewed including infection, bleeding, injury to surrounding organ structures, internal scar tissue, poss need for blood transfusion and its risk, possible need for open /transverse incision., retention of ovaries with possible need for surgery in the future due to ovarian cyst, pain or cancer. All ? answered   Xzavien Harada A Maudie Shingledecker 09/25/2021, 6:21 AM

## 2021-09-26 DIAGNOSIS — D259 Leiomyoma of uterus, unspecified: Secondary | ICD-10-CM | POA: Diagnosis not present

## 2021-09-26 LAB — BASIC METABOLIC PANEL
Anion gap: 5 (ref 5–15)
BUN: 6 mg/dL (ref 6–20)
CO2: 26 mmol/L (ref 22–32)
Calcium: 8.1 mg/dL — ABNORMAL LOW (ref 8.9–10.3)
Chloride: 102 mmol/L (ref 98–111)
Creatinine, Ser: 0.74 mg/dL (ref 0.44–1.00)
GFR, Estimated: >60 mL/min (ref >60)
Glucose, Bld: 102 mg/dL — ABNORMAL HIGH (ref 70–99)
Potassium: 3.6 mmol/L (ref 3.5–5.1)
Sodium: 133 mmol/L — ABNORMAL LOW (ref 135–145)

## 2021-09-26 LAB — CBC
HCT: 32 % — ABNORMAL LOW (ref 36.0–46.0)
Hemoglobin: 9.9 g/dL — ABNORMAL LOW (ref 12.0–15.0)
MCH: 27.1 pg (ref 26.0–34.0)
MCHC: 30.9 g/dL (ref 30.0–36.0)
MCV: 87.7 fL (ref 80.0–100.0)
Platelets: 232 10*3/uL (ref 150–400)
RBC: 3.65 MIL/uL — ABNORMAL LOW (ref 3.87–5.11)
RDW: 14.9 % (ref 11.5–15.5)
WBC: 9.2 10*3/uL (ref 4.0–10.5)
nRBC: 0 % (ref 0.0–0.2)

## 2021-09-26 MED ORDER — ACETAMINOPHEN 500 MG PO TABS
ORAL_TABLET | ORAL | Status: AC
  Filled 2021-09-26: qty 2

## 2021-09-26 MED ORDER — OXYCODONE HCL 5 MG PO TABS
ORAL_TABLET | ORAL | Status: AC
  Filled 2021-09-26: qty 2

## 2021-09-26 MED ORDER — ONDANSETRON HCL 4 MG/2ML IJ SOLN
INTRAMUSCULAR | Status: AC
  Filled 2021-09-26: qty 2

## 2021-09-26 MED ORDER — IBUPROFEN 200 MG PO TABS
ORAL_TABLET | ORAL | Status: AC
  Filled 2021-09-26: qty 3

## 2021-09-26 MED ORDER — ONDANSETRON HCL 4 MG PO TABS: 4.0000 mg | ORAL_TABLET | Freq: Three times a day (TID) | ORAL | 0 refills | Status: AC | PRN

## 2021-09-26 MED ORDER — IBUPROFEN 600 MG PO TABS: 600.0000 mg | ORAL_TABLET | Freq: Four times a day (QID) | ORAL | 11 refills | Status: AC | PRN

## 2021-09-26 MED ORDER — HYDROMORPHONE HCL 4 MG PO TABS
4.0000 mg | ORAL_TABLET | Freq: Four times a day (QID) | ORAL | 0 refills | Status: AC | PRN
Start: 2021-09-26 — End: 2021-10-03

## 2021-09-26 NOTE — Progress Notes (Signed)
Subjective: Patient reports nausea, tolerating PO, and no problems voiding.    Objective: I have reviewed patient's vital signs.  vital signs, intake and output, medications, and labs. Vitals:   09/26/21 0500 09/26/21 0827  BP: 95/67 110/79  Pulse: 76 70  Resp: 16 16  Temp: 98.3 F (36.8 C) 98.8 F (37.1 C)  SpO2: 97% 97%   I/O last 3 completed shifts: In: 3006.3 [I.V.:2906.3; IV Piggyback:100] Out: 2250 [Urine:1900; Blood:350] No intake/output data recorded.  Lab Results  Component Value Date   WBC 9.2 09/26/2021   HGB 9.9 (L) 09/26/2021   HCT 32.0 (L) 09/26/2021   MCV 87.7 09/26/2021   PLT 232 09/26/2021   Lab Results  Component Value Date   CREATININE 0.74 09/26/2021    EXAM General: alert, cooperative, and no distress Resp: clear to auscultation bilaterally Cardio: regular rate and rhythm, S1, S2 normal, no murmur, click, rub or gallop GI: incision: clean, dry, and intact Extremities: no edema, redness or tenderness in the calves or thighs Vaginal Bleeding: none  Assessment: s/p Procedure(s): XI ROBOTIC ASSISTED LAPAROSCOPICr TOTAL  HYSTERECTOMY AND BILATERAL SALPINGECTOMY CYSTOSCOPY: unstable, progressing well, tolerating diet, and anemia  Plan: Encourage ambulation Discontinue IV fluids Discharge home Encourage incentive spirometry use, gatorade to increase Na D/c instructions reviewed  Change to Dilaudid for po pain mgmt F/u 2 wk  LOS: 0 days    Marvene Staff, MD 09/26/2021 8:51 AM    09/26/2021, 8:51 AM

## 2021-09-26 NOTE — Discharge Summary (Signed)
Physician Discharge Summary  Patient ID: [name] MRN: [id number] DOB/AGE: 1970/12/22 51 y.o.  Admit date: 09/25/2021 Discharge date: 09/26/2021  Admission Diagnoses: symptomatic uterine fibroids  Discharge Diagnoses: symptomatic uterine fibroids Principal Problem:   Uterine fibroid Active Problems:   S/P hysterectomy   Discharged Condition: stable  Hospital Course: pt underwent daVinci total hysterectomy, bilateral salpingectomy, see operative reports. Pt had  mild elevated temp Pod #0 thought related to atelectasis, some nausea in am  attributed to pain med otherwise deemed well for discharge   Consults: None  Significant Diagnostic Studies: labs: cbc. Bmet CBC Latest Ref Rng & Units 09/26/2021 09/22/2021 06/26/2013  WBC 4.0 - 10.5 K/uL 9.2 4.3 -  Hemoglobin 12.0 - 15.0 g/dL 9.9(L) 12.2 13.6  Hematocrit 36.0 - 46.0 % 32.0(L) 40.0 40.0  Platelets 150 - 400 K/uL 232 308 -    BMP Latest Ref Rng & Units 09/26/2021 09/22/2021 06/26/2013  Glucose 70 - 99 mg/dL 102(H) 96 87  BUN 6 - 20 mg/dL 6 9 11   Creatinine 0.44 - 1.00 mg/dL 0.74 0.66 0.70  Sodium 135 - 145 mmol/L 133(L) 136 142  Potassium 3.5 - 5.1 mmol/L 3.6 4.0 4.0  Chloride 98 - 111 mmol/L 102 106 105  CO2 22 - 32 mmol/L 26 26 -  Calcium 8.9 - 10.3 mg/dL 8.1(L) 8.7(L) -     Treatments: surgery: da Vinci robotic total hysterectomy, bilateral salpingectomy  Discharge Exam: Blood pressure 110/79, pulse 70, temperature 98.8 F (37.1 C), resp. rate 16, height 5\' 6"  (1.676 m), weight 78.5 kg, last menstrual period 08/24/2021, SpO2 97 %. General appearance: alert, cooperative, and no distress Back: symmetric, no curvature. ROM normal. No CVA tenderness. Resp: clear to auscultation bilaterally Cardio: regular rate and rhythm, S1, S2 normal, no murmur, click, rub or gallop GI: soft (+) BS non distended  Extremities: no edema, redness or tenderness in the calves or thighs Incision/Wound: well approximated no  erythema/induration or exudate  Disposition: Discharge disposition: 01-Home or Self Care       Discharge Instructions     Call MD for:  persistant dizziness or light-headedness   Complete by: As directed    Call MD for:  persistant nausea and vomiting   Complete by: As directed    Call MD for:  severe uncontrolled pain   Complete by: As directed    Call MD for:  temperature >100.4   Complete by: As directed    Diet general   Complete by: As directed    May walk up steps   Complete by: As directed    No wound care   Complete by: As directed       Allergies as of 09/26/2021   No Known Allergies      Medication List     TAKE these medications    ferrous sulfate 325 (65 FE) MG tablet Take 325 mg by mouth daily.   HYDROmorphone 4 MG tablet Commonly known as: Dilaudid Take 1 tablet (4 mg total) by mouth every 6 (six) hours as needed for up to 7 days for severe pain.   ibuprofen 600 MG tablet Commonly known as: ADVIL Take 1 tablet (600 mg total) by mouth every 6 (six) hours as needed.   ondansetron 4 MG tablet Commonly known as: ZOFRAN Take 1 tablet (4 mg total) by mouth every 8 (eight) hours as needed for nausea.   Vitamin D-3 25 MCG (1000 UT) Caps Take by mouth.        Follow-up  Information     Servando Salina, MD Follow up in 2 week(s).   Specialty: Obstetrics and Gynecology Contact information: [address] [place] [address] [phone]                 Signed: Marvene Staff 09/26/2021, 8:57 AM

## 2021-09-26 NOTE — Discharge Instructions (Signed)
Call if temperature greater than equal to 100.4, nothing per vagina for 4-6 weeks or severe nausea vomiting, increased incisional pain , drainage or redness in the incision site, no straining with bowel movements, showers no bath °

## 2021-09-28 ENCOUNTER — Encounter (HOSPITAL_BASED_OUTPATIENT_CLINIC_OR_DEPARTMENT_OTHER): Payer: Self-pay | Admitting: Obstetrics and Gynecology

## 2021-09-29 LAB — SURGICAL PATHOLOGY

## 2022-01-18 ENCOUNTER — Other Ambulatory Visit: Payer: Self-pay

## 2022-01-18 ENCOUNTER — Emergency Department (HOSPITAL_BASED_OUTPATIENT_CLINIC_OR_DEPARTMENT_OTHER)
Admission: EM | Admit: 2022-01-18 | Discharge: 2022-01-18 | Disposition: A | Discharge status: Home / Self Care | Payer: Managed Care, Other (non HMO) | Attending: Emergency Medicine | Admitting: Emergency Medicine

## 2022-01-18 ENCOUNTER — Encounter (HOSPITAL_BASED_OUTPATIENT_CLINIC_OR_DEPARTMENT_OTHER): Payer: Self-pay

## 2022-01-18 DIAGNOSIS — N939 Abnormal uterine and vaginal bleeding, unspecified: Secondary | ICD-10-CM | POA: Diagnosis not present

## 2022-01-18 DIAGNOSIS — R102 Pelvic and perineal pain: Secondary | ICD-10-CM | POA: Diagnosis present

## 2022-01-18 NOTE — ED Notes (Signed)
Triage done by Henri Medal, RN  Pt unable to urinate at this time, spec cup given

## 2022-01-18 NOTE — Discharge Instructions (Signed)
You were seen today for pelvic pain. No signs of injury or bleeding were noted in your pelvic exam. I recommend that you follow up at your scheduled OB/GYN appointment on Thursday. Please use Tylenol and Advil as needed for your pain. If your condition worsens or you develop other life threatening conditions please seek further evaluation immediately.

## 2022-01-18 NOTE — ED Triage Notes (Signed)
Hysterectomy on 2/24 States feels like she is having internal bleeding on Saturday after sex States lower pelvic pain, cramping and pressure   Recently finished meds Nitrofurantoin for UTI and diflucan for yeast infection at end of may

## 2022-01-18 NOTE — ED Provider Notes (Signed)
[facility] Provider Note   CSN: [id number] Arrival date & time: 01/18/22  1502     History  Chief Complaint  Patient presents with   Pelvic Pain    [name] is a 51 y.o. female.  Patient presents department complaining pain, pressure, after having sexual intercourse Saturday night.  The patient had a hysterectomy in February of this year.  Patient states that the pain/pressure similar to when she had uterine fibroids prior to surgery.  The patient did note some vaginal bleeding Saturday night.  She states she has no vaginal bleeding at this time.  Denies discharge, urinary symptoms.  Patient did have a negative tract infection in room and was treated with nitrofurantoin and Diflucan for subsequent yeast infection.  Past medical history significant for hysterectomy and salpingectomy in February 2023, history of ADD, hemorrhoids, constipation   HPI     Home Medications Prior to Admission medications   Medication Sig Start Date End Date Taking? Authorizing Provider  Cholecalciferol (VITAMIN D-3) 25 MCG (1000 UT) CAPS Take by mouth.    [provider]  ferrous sulfate 325 (65 FE) MG tablet Take 325 mg by mouth daily. 04/01/21   [provider]  ibuprofen (ADVIL) 600 MG tablet Take 1 tablet (600 mg total) by mouth every 6 (six) hours as needed. 09/26/21   Servando Salina, MD  ondansetron (ZOFRAN) 4 MG tablet Take 1 tablet (4 mg total) by mouth every 8 (eight) hours as needed for nausea. 09/26/21   Servando Salina, MD      Allergies    Patient has no known allergies.    Review of Systems   Review of Systems  Constitutional:  Negative for fever.  Respiratory:  Negative for shortness of breath.   Gastrointestinal:  Negative for nausea and vomiting.  Genitourinary:  Positive for pelvic pain and vaginal bleeding. Negative for vaginal discharge.    Physical Exam Updated Vital Signs BP 110/83 (BP Location: Right Arm)    Pulse (!) 101   Temp 98.9 F (37.2 C) (Oral)   Resp 17   Ht '5\' 6"'$  (1.676 m)   Wt 77.1 kg   LMP 08/24/2021 (Exact Date)   SpO2 98%   BMI 27.44 kg/m  Physical Exam Vitals and nursing note reviewed. Exam conducted with a chaperone present.  Constitutional:      General: She is not in acute distress. HENT:     Head: Normocephalic and atraumatic.     Mouth/Throat:     Mouth: Mucous membranes are moist.  Eyes:     Conjunctiva/sclera: Conjunctivae normal.  Cardiovascular:     Rate and Rhythm: Normal rate.  Pulmonary:     Effort: Pulmonary effort is normal.  Abdominal:     Palpations: Abdomen is soft.     Comments: Mild suprapubic tenderness  Genitourinary:    General: Normal vulva.     Exam position: Lithotomy position.     Labia:        Right: No tenderness.        Left: No tenderness.      Vagina: No signs of injury and foreign body. No bleeding.  Musculoskeletal:     Cervical back: Normal range of motion.  Skin:    General: Skin is warm and dry.  Neurological:     Mental Status: She is alert.     ED Results / Procedures / Treatments   Labs (all labs ordered are listed, but only abnormal results are displayed)  Labs Reviewed - No data to display   EKG None  Radiology No results found.  Procedures Procedures    Medications Ordered in ED Medications - No data to display  ED Course/ Medical Decision Making/ A&P Clinical Course as of 01/18/22 1605  Mon Jan 18, 2022  1556 This is a 51 yo female presenting with abdominal/pelvic pain in the setting of sexual intercourse 2 days ago.  Hx of hystectomy in Feb 2023, and she has been sexually active with penetrative intercourse with the same female partner since then, without any pain.  She reports she began having cramping abdominal pain during intercourse, had a small amount of bleeding after sex from the vagina.  The bleeding has since stopped, and her pain has improved today, now 8/10, but still present.  She has  OB appointment in 2 days.  The patient had above exam performed the PA provider, who noted no visible bleeding or evidence of vaginal wall laceration.  On my lower abdominal exam, she does express tenderness near the suprapubic region, but does not have rigidity or guarding.  I discussed the very small possibility, which I would suspect to be unlikely, of the vaginal wall perforation, the option of a CT scan in the ED, versus watchful waiting with OB follow-up, which is also reasonable, given that her pain is improving.  She reiterates that she did not use any additional objects which would be higher risk for penetration during sex, other than intercourse with her female partner.  The patient would prefer to continue with watchful waiting and OB follow-up, can take ibuprofen and Tylenol as needed for her lower abdominal pain.  She did complete recently course of antibiotics for UTI, but this would not seem consistent with urinary tract infection, given the onset during intercourse. [MT]    Clinical Course User Index [MT] Trifan, Carola Rhine, MD                           Medical Decision Making Amount and/or Complexity of Data Reviewed Labs: ordered.   The patient presents with a chief complaint of bleeding and pelvic pain after intercourse.  The pelvic exam shows no lesions, bleeding, or obvious signs of trauma in the vagina.  The patient does have some tenderness in the suprapubic area.   No tenderness/pain concerning for torsion or ruptured cyst.  I did review charts from her surgery in February of 2023 showing recent hysterectomy.  No uterus, no fallopian tubes, no concern at this time for ectopic pregnancy.   Pain beginning immediately after intercourse seems to suggest a traumatic etiology. Patient denies using objects during intercourse, lowering concern for perforation.No peritoneal signs, no RLQ quadrant pain, no pain out of proportion to require CT scan at this time. Dr.Trifan, attending,  did offer CT scan for thoroughness but patient declined at this time which is reasonable.   Patient was able to schedule an exam with her GYN this morning for Thursday. At this time see no emergent cause of her discomfort and recommend that she follow up with GYN. She may use Tylenol, advil as needed for pain control.         Final Clinical Impression(s) / ED Diagnoses Final diagnoses:  Pelvic pain in female    Rx / DC Orders ED Discharge Orders     None         Ronny Bacon 01/18/22 1605    Trifan, Carola Rhine, MD  01/19/22 1031  

## 2022-01-18 NOTE — ED Notes (Signed)
D/c paperwork reviewed with pt. Pt with no questions or concerns at time of d/c. Ambulatory to ED exit without assistance, NAD noted.

## 2022-06-08 ENCOUNTER — Ambulatory Visit
Admission: EM | Admit: 2022-06-08 | Discharge: 2022-06-08 | Disposition: A | Discharge status: Home / Self Care | Payer: Managed Care, Other (non HMO) | Attending: Physician Assistant | Admitting: Physician Assistant

## 2022-06-08 DIAGNOSIS — J069 Acute upper respiratory infection, unspecified: Secondary | ICD-10-CM | POA: Diagnosis not present

## 2022-06-08 DIAGNOSIS — Z1152 Encounter for screening for COVID-19: Secondary | ICD-10-CM | POA: Diagnosis present

## 2022-06-08 LAB — POCT INFLUENZA A/B
Influenza A, POC: NEGATIVE
Influenza B, POC: NEGATIVE

## 2022-06-08 NOTE — ED Provider Notes (Signed)
[facility]    CSN: [id number] Arrival date & time: 06/08/22  1550      History   Chief Complaint Chief Complaint  Patient presents with   URI    HPI [name] is a 51 y.o. female.   Patient here today for evaluation of nonproductive cough, congestion, chills and body aches she has had for 2 days.  She denies any vomiting or diarrhea.  She has taken over-the-counter medication with mild relief.  The history is provided by the patient.  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   Hemorrhoids    Insomnia    Left breast mass    Palpitations 07/2021   Pt saw cardiologist, Dr.Sunit Tolia with [facility]. Per Dr. Terri Skains 's OV note from 07/22/21, palpitations were likely due to iron deficiency anemia. EKG was normal. A 7 day heart monitor was ordered but patient never completed test.Per pt, she has not had any palpitations in February 2023.   Simple ovarian cyst     Patient Active Problem List   Diagnosis Date Noted   Uterine fibroid 09/25/2021   S/P hysterectomy 09/25/2021   Benign breast cyst in female 04/17/2012    Past Surgical History:  Procedure Laterality Date   ABDOMINAL HYSTERECTOMY     BREAST CYST EXCISION Right 2005   COLONOSCOPY WITH ESOPHAGOGASTRODUODENOSCOPY (EGD)  07/19/2013   CYSTOSCOPY N/A 09/25/2021   Procedure: CYSTOSCOPY;  Surgeon: Servando Salina, MD;  Location: [facility];  Service: Gynecology;  Laterality: N/A;   KNEE  SURGERY Left 1986   ROBOTIC ASSISTED LAPAROSCOPIC HYSTERECTOMY AND SALPINGECTOMY N/A 09/25/2021   Procedure: XI ROBOTIC ASSISTED LAPAROSCOPIC HYSTERECTOMY AND SALPINGECTOMY;  Surgeon: Servando Salina, MD;  Location: [facility];  Service: Gynecology;  Laterality: N/A;   TUBAL LIGATION  2006    OB History     Gravida  5   Para  3   Term      Preterm      AB      Living  3      SAB      IAB      Ectopic      Multiple      Live Births               Home Medications    Prior to Admission medications   Medication Sig Start Date End Date Taking? Authorizing Provider  Cholecalciferol (VITAMIN D-3) 25 MCG (1000 UT) CAPS Take by mouth.    [provider]  ferrous sulfate 325 (65 FE) MG tablet Take 325 mg by mouth daily. 04/01/21   [provider]  ibuprofen (ADVIL) 600 MG tablet Take 1 tablet (600 mg total) by mouth every 6 (six) hours as needed. 09/26/21   Servando Salina, MD  ondansetron (ZOFRAN) 4 MG tablet Take 1 tablet (4 mg total) by mouth every  8 (eight) hours as needed for nausea. 09/26/21   Servando Salina, MD    Family History Family History  Problem Relation Age of Onset   Diabetes Mother    Alcohol abuse Mother    Colon polyps Mother    Dementia Mother 34   Dementia Father    Breast cancer Sister    Hypertension Sister    Sarcoidosis Sister    Colon polyps Sister    Diabetes Maternal Grandmother    Aneurysm Maternal Grandmother    Cancer Paternal Grandmother        type unknown   Breast cancer Cousin     Social History Social History   Tobacco Use   Smoking status: Never   Smokeless tobacco: Never  Vaping Use   Vaping Use: Never used  Substance Use Topics   Alcohol use: Yes    Alcohol/week: 3.0 standard drinks of alcohol    Types: 3 Glasses of wine per week    Comment: about 3 glasses of wine per week   Drug use: No     Allergies   Patient has no known allergies.   Review of  Systems Review of Systems  Constitutional:  Positive for chills. Negative for fever.  HENT:  Positive for congestion. Negative for ear pain and sore throat.   Eyes:  Negative for discharge and redness.  Respiratory:  Positive for cough. Negative for shortness of breath and wheezing.   Gastrointestinal:  Negative for abdominal pain, diarrhea, nausea and vomiting.     Physical Exam Triage Vital Signs ED Triage Vitals  Enc Vitals Group     BP 06/08/22 1559 118/82     Pulse Rate 06/08/22 1559 98     Resp 06/08/22 1559 18     Temp 06/08/22 1559 99.3 F (37.4 C)     Temp Source 06/08/22 1559 Oral     SpO2 06/08/22 1559 97 %     Weight --      Height --      Head Circumference --      Peak Flow --      Pain Score 06/08/22 1600 5     Pain Loc --      Pain Edu? --      Excl. in [place]? --    No data found.  Updated Vital Signs BP 118/82 (BP Location: Left Arm)   Pulse 98   Temp 99.3 F (37.4 C) (Oral)   Resp 18   LMP 08/24/2021 (Exact Date)   SpO2 97%   Physical Exam Vitals and nursing note reviewed.  Constitutional:      General: She is not in acute distress.    Appearance: Normal appearance. She is not ill-appearing.  HENT:     Head: Normocephalic and atraumatic.     Nose: Congestion present.     Mouth/Throat:     Mouth: Mucous membranes are moist.     Pharynx: No oropharyngeal exudate or posterior oropharyngeal erythema.  Eyes:     Conjunctiva/sclera: Conjunctivae normal.  Cardiovascular:     Rate and Rhythm: Normal rate and regular rhythm.     Heart sounds: Normal heart sounds. No murmur heard. Pulmonary:     Effort: Pulmonary effort is normal. No respiratory distress.     Breath sounds: Normal breath sounds. No wheezing, rhonchi or rales.  Skin:    General: Skin is warm and dry.  Neurological:     Mental Status: She is alert.  Psychiatric:  Mood and Affect: Mood normal.        Thought Content: Thought content normal.      UC Treatments / Results   Labs (all labs ordered are listed, but only abnormal results are displayed) Labs Reviewed  SARS CORONAVIRUS 2 (TAT 6-24 HRS)  POCT INFLUENZA A/B    EKG   Radiology No results found.  Procedures Procedures (including critical care time)  Medications Ordered in UC Medications - No data to display  Initial Impression / Assessment and Plan / UC Course  I have reviewed the triage vital signs and the nursing notes.  Pertinent labs & imaging results that were available during my care of the patient were reviewed by me and considered in my medical decision making (see chart for details).    We will screen for COVID and flu.  Rapid flu test negative in office.  Encouraged symptomatic treatment, increase fluids and rest.  Recommended follow-up if no gradual improvement or with any further concerns.  Final Clinical Impressions(s) / UC Diagnoses   Final diagnoses:  Encounter for screening for COVID-19  Acute upper respiratory infection   Discharge Instructions   None    ED Prescriptions   None    PDMP not reviewed this encounter.   Francene Finders, PA-C 06/08/22 1639

## 2022-06-08 NOTE — ED Triage Notes (Signed)
Pt presents with non productive cough, congestion, chills, and generalized body aches X 2 days.

## 2022-06-09 LAB — SARS CORONAVIRUS 2 (TAT 6-24 HRS): SARS Coronavirus 2: POSITIVE — AB

## 2022-07-09 ENCOUNTER — Other Ambulatory Visit: Payer: Self-pay | Admitting: Obstetrics and Gynecology

## 2022-07-09 DIAGNOSIS — Z1231 Encounter for screening mammogram for malignant neoplasm of breast: Secondary | ICD-10-CM

## 2022-09-03 ENCOUNTER — Ambulatory Visit: Payer: Managed Care, Other (non HMO)

## 2023-04-29 ENCOUNTER — Ambulatory Visit
Admission: EM | Admit: 2023-04-29 | Discharge: 2023-04-29 | Disposition: A | Discharge status: Home / Self Care | Payer: Managed Care, Other (non HMO)

## 2023-04-29 DIAGNOSIS — T63421A Toxic effect of venom of ants, accidental (unintentional), initial encounter: Secondary | ICD-10-CM

## 2023-04-29 MED ORDER — PREDNISONE 20 MG PO TABS
40.0000 mg | ORAL_TABLET | Freq: Every day | ORAL | 0 refills | Status: AC
Start: 2023-04-29 — End: 2023-05-04

## 2023-04-29 NOTE — ED Triage Notes (Signed)
"  I was attacked by [employer] on Tuesday, stepped in nest of them on Tuesday". Redness, swelling, itching and pain on both legs "lower legs and feet". No respiratory distress or symptoms of anaphylaxis.

## 2023-04-29 NOTE — ED Provider Notes (Signed)
[facility]    CSN: [id number] Arrival date & time: 04/29/23  0908      History   Chief Complaint Chief Complaint  Patient presents with   Insect Bite    HPI [name] is a 52 y.o. female.   Patient here today for evaluation of mild swelling to bilateral ankles after being bit by fire ants 2 days ago.  She reports that she has had itching and burning in the area.  She denies any numbness or tingling.  She denies any trouble swallowing or breathing.  She does report that swelling does improve after elevating legs at night.  She has tried topical hydrocortisone without resolution.  The history is provided by the patient.    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   Hemorrhoids    Insomnia    Left breast mass    Palpitations 07/2021   Pt saw cardiologist, Dr.Sunit Tolia with [facility]. Per Dr. Odis Hollingshead 's OV note from 07/22/21, palpitations were likely due to iron deficiency anemia. EKG was normal. A 7 day heart monitor was ordered but patient never completed test.Per pt, she has not had any palpitations in February 2023.   Simple ovarian cyst     Patient Active Problem List   Diagnosis Date Noted   Uterine fibroid 09/25/2021   S/P hysterectomy 09/25/2021   Benign breast cyst in female 04/17/2012    Past Surgical History:  Procedure Laterality Date   ABDOMINAL HYSTERECTOMY     BREAST CYST EXCISION Right 2005   COLONOSCOPY WITH ESOPHAGOGASTRODUODENOSCOPY (EGD)  07/19/2013   CYSTOSCOPY N/A 09/25/2021   Procedure: CYSTOSCOPY;  Surgeon: Maxie Better, MD;  Location: [facility];  Service:  Gynecology;  Laterality: N/A;   KNEE SURGERY Left 1986   ROBOTIC ASSISTED LAPAROSCOPIC HYSTERECTOMY AND SALPINGECTOMY N/A 09/25/2021   Procedure: XI ROBOTIC ASSISTED LAPAROSCOPIC HYSTERECTOMY AND SALPINGECTOMY;  Surgeon: Maxie Better, MD;  Location: [facility] [place];  Service: Gynecology;  Laterality: N/A;   TUBAL LIGATION  2006    OB History     Gravida  5   Para  3   Term      Preterm      AB      Living  3      SAB      IAB      Ectopic      Multiple      Live Births               Home Medications    Prior to Admission medications   Medication Sig Start Date End Date Taking? Authorizing Provider  desonide (DESOWEN) 0.05 % ointment Apply 1 Application topically daily as needed (Apply topical to the itchy spots once a day as need it). 05/03/22  Yes [provider]  predniSONE (DELTASONE) 20 MG tablet Take 2 tablets (40 mg total) by mouth daily with breakfast for 5 days. 04/29/23 05/04/23 Yes Tomi Bamberger, PA-C  tretinoin (RETIN-A) 0.025 % cream Apply 1 Application topically at bedtime. 05/03/22  Yes [provider]  UNABLE TO FIND Med Name: Elderberry's and other supplements from time to time.   Yes [provider]  Cholecalciferol (VITAMIN D-3) 25 MCG (1000 UT) CAPS Take by mouth.    [provider]  ferrous sulfate 325 (65 FE) MG tablet Take 325 mg by mouth daily. 04/01/21   [provider]  ibuprofen (ADVIL) 600 MG tablet Take 1 tablet (600 mg total) by mouth every 6 (six) hours as needed. 09/26/21   Maxie Better, MD  ondansetron (ZOFRAN) 4 MG tablet Take 1 tablet (4 mg total) by mouth every 8 (eight) hours as needed for nausea. 09/26/21   Maxie Better, MD    Family History Family History  Problem Relation Age of Onset   Diabetes Mother    Alcohol abuse Mother    Colon polyps Mother    Dementia Mother 4   Dementia Father    Breast cancer Sister    Hypertension Sister     Sarcoidosis Sister    Colon polyps Sister    Diabetes Maternal Grandmother    Aneurysm Maternal Grandmother    Cancer Paternal Grandmother        type unknown   Breast cancer Cousin     Social History Social History   Tobacco Use   Smoking status: Never   Smokeless tobacco: Never  Vaping Use   Vaping status: Never Used  Substance Use Topics   Alcohol use: Yes    Alcohol/week: 3.0 standard drinks of alcohol    Types: 3 Glasses of wine per week    Comment: about 3 glasses of wine per week   Drug use: No     Allergies   Patient has no known allergies.   Review of Systems Review of Systems  Constitutional:  Negative for chills and fever.  HENT:  Negative for facial swelling and trouble swallowing.   Eyes:  Negative for discharge and redness.  Respiratory:  Negative for shortness of breath.   Gastrointestinal:  Negative for abdominal pain, nausea and vomiting.  Skin:  Positive for color change. Negative for wound.  Neurological:  Negative for numbness.     Physical Exam Triage Vital Signs ED Triage Vitals  Encounter Vitals Group     BP 04/29/23 0916 125/83     Systolic BP Percentile --      Diastolic BP Percentile --      Pulse Rate 04/29/23 0916 83     Resp 04/29/23 0916 18     Temp 04/29/23 0916 98.9 F (37.2 C)     Temp Source 04/29/23 0916 Oral     SpO2 04/29/23 0916 97 %     Weight 04/29/23 0915 169 lb (76.7 kg)     Height 04/29/23 0915 5\' 6"  (1.676 m)     Head Circumference --      Peak Flow --      Pain Score 04/29/23 0912 8     Pain Loc --      Pain Education --      Exclude from Growth Chart --    No data found.  Updated Vital Signs BP 125/83 (BP Location: Left Arm)   Pulse 83   Temp 98.9 F (37.2 C) (Oral)   Resp 18   Ht 5\' 6"  (1.676 m)   Wt 169 lb (76.7 kg)   LMP 08/24/2021 (Exact Date)   SpO2 97%   BMI 27.28 kg/m      Physical Exam Vitals and nursing note reviewed.  Constitutional:      General: She is not in acute distress.     Appearance: Normal appearance. She is not ill-appearing.  HENT:     Head: Normocephalic and atraumatic.  Eyes:     Conjunctiva/sclera: Conjunctivae normal.  Cardiovascular:     Rate and Rhythm: Normal rate.  Pulmonary:     Effort: Pulmonary effort is normal. No respiratory distress.  Skin:    Comments: Multiple erythematous papular lesions noted circumferentially around bilateral ankles with mild associated swelling  Neurological:     Mental Status: She is alert.  Psychiatric:        Mood and Affect: Mood normal.        Behavior: Behavior normal.        Thought Content: Thought content normal.      UC Treatments / Results  Labs (all labs ordered are listed, but only abnormal results are displayed) Labs Reviewed - No data to display  EKG   Radiology No results found.  Procedures Procedures (including critical care time)  Medications Ordered in UC Medications - No data to display  Initial Impression / Assessment and Plan / UC Course  I have reviewed the triage vital signs and the nursing notes.  Pertinent labs & imaging results that were available during my care of the patient were reviewed by me and considered in my medical decision making (see chart for details).    Steroid burst prescribed.  Recommended follow-up if no gradual improvement with any worsening symptoms.  Patient expressed understanding.  Final Clinical Impressions(s) / UC Diagnoses   Final diagnoses:  Fire ant bite, accidental or unintentional, initial encounter   Discharge Instructions   None    ED Prescriptions     Medication Sig Dispense Auth. Provider   predniSONE (DELTASONE) 20 MG tablet Take 2 tablets (40 mg total) by mouth daily with breakfast for 5 days. 10 tablet Tomi Bamberger, PA-C      PDMP not reviewed this encounter.   Tomi Bamberger, PA-C 04/29/23 1128

## 2023-07-21 ENCOUNTER — Encounter: Payer: Managed Care, Other (non HMO) | Attending: Family Medicine | Admitting: Dietician

## 2023-07-21 ENCOUNTER — Other Ambulatory Visit: Payer: Self-pay | Admitting: Obstetrics and Gynecology

## 2023-07-21 DIAGNOSIS — Z1231 Encounter for screening mammogram for malignant neoplasm of breast: Secondary | ICD-10-CM

## 2023-07-21 DIAGNOSIS — R7303 Prediabetes: Secondary | ICD-10-CM | POA: Insufficient documentation

## 2023-07-21 NOTE — Progress Notes (Signed)
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   Hemorrhoids    Insomnia    Left breast mass    Palpitations 07/2021   Pt saw cardiologist, Dr.Sunit Tolia with [facility]. Per Dr. Odis Hollingshead 's OV note from 07/22/21, palpitations were likely due to iron deficiency anemia. EKG was normal. A 7 day heart monitor was ordered but patient never completed test.Per pt, she has not had any palpitations in February 2023.   Simple ovarian cyst     Medications:  Current Outpatient Medications:    desonide (DESOWEN) 0.05 % ointment, Apply 1 Application topically daily as needed (Apply topical to the itchy spots once a day as need it)., Disp: , Rfl:    Multiple Vitamin (MULTIVITAMIN WITH MINERALS) TABS tablet, Take 1 tablet by mouth daily., Disp: , Rfl:    UNABLE TO FIND, Med Name: Elderberry's and other supplements from time to time., Disp: , Rfl:    Cholecalciferol (VITAMIN D-3) 25 MCG (1000 UT) CAPS, Take by mouth. (Patient not taking: Reported on 07/21/2023), Disp: , Rfl:    ferrous sulfate 325 (65 FE) MG tablet, Take 325 mg by mouth daily. (Patient not taking: Reported on 07/21/2023), Disp: , Rfl:    ibuprofen (ADVIL) 600 MG tablet, Take 1 tablet (600 mg total) by mouth every 6 (six) hours as needed. (Patient not taking: Reported on 07/21/2023), Disp: 30 tablet, Rfl: 11    ondansetron (ZOFRAN) 4 MG tablet, Take 1 tablet (4 mg total) by mouth every 8 (eight) hours as needed for nausea. (Patient not taking: Reported on 07/21/2023), Disp: 30 tablet, Rfl: 0   tretinoin (RETIN-A) 0.025 % cream, Apply 1 Application topically at bedtime. (Patient not taking: Reported on 07/21/2023), Disp: , Rfl:     Labs: 05/10/2023 6.0% per referring MD Total cholesterol 206, Triglycerides 54, HDL 67, LDL 129 per referring MD   Lifestyle & Dietary Hx Pt presents today alone. Pt reports WFH full time mostly sit down. Pt reports she got a A1C screen at her job of 5.8% early this year. Pt reports she enjoys running.    Pt reports    Estimated daily fluid intake: 48 oz + Supplements: MVI  Sleep: 5-6 hour sleep  Stress / self-care: 4 out of 10 / deep breathing  Current average weekly physical activity: none  24-Hr Dietary Recall First Meal: [place] sausage, 2 boiled, raw spinach, 1/4 apple or old fashioned oat, honey, cinnamon, 1/4 apple, handful berries, coffee with 2 tbsp raw brown sugar, peppermint mocha creamer Snack: none Second Meal: noodles, alfredo, shrimp, mushroom, sweet tea Snack: chobani flip peanut butter yogurt or chips  Third Meal: skip 3/d/w or chic fa la or apples, berries, walnuts, cranberries, grilled chicken in a salad  Snack: yogurt occasionally  Beverages: water, sweet  tea, coffee with 2 tbsp raw brown sugar, peppermint mocha creamer, lemonade    NUTRITION DIAGNOSIS  NB-1.1 Food and nutrition-related knowledge deficit As related to no prior nutrition related education .  As evidenced by Pt's reports and dietary recall.   NUTRITION INTERVENTION  Nutrition education (E-1) on the following topics:  Fruits & Vegetables: Aim to fill half your plate with a variety of fruits and vegetables. They are rich in vitamins, minerals, and fiber, and can help reduce the risk of chronic diseases. Choose a colorful assortment of fruits and vegetables to ensure you get a wide  range of nutrients. Grains and Starches: Make at least half of your grain choices whole grains, such as brown rice, whole wheat bread, and oats. Whole grains provide fiber, which aids in digestion and healthy cholesterol levels. Aim for whole forms of starchy vegetables such as potatoes, sweet potatoes, beans, peas, and corn, which are fiber rich and provide many vitamins and minerals.  Protein: Incorporate lean sources of protein, such as poultry, fish, beans, nuts, and seeds, into your meals. Protein is essential for building and repairing tissues, staying full, balancing blood sugar, as well as supporting immune function. Dairy: Include low-fat or fat-free dairy products like milk, yogurt, and cheese in your diet. Dairy foods are excellent sources of calcium and vitamin D, which are crucial for bone health.  Physical Activity: Aim for 30-60 minutes of physical activity daily. Regular physical activity promotes overall health-including helping to reduce risk for heart disease and diabetes, promoting mental health, and helping [place] sleep better.    Handouts Provided Include  Plate Planner Snack Ideas What is Prediabetes -ADA Move Your Way- DH[employer] CDC's DPP local program registration website for enrollment as desired  Learning Style & Readiness for Change Teaching method utilized: Visual & Auditory  Demonstrated degree of understanding via: Teach Back  Barriers to learning/adherence to lifestyle change: none  Goals Established by Pt Decreasing and limiting intake of sugary sweetened beverages    MONITORING & EVALUATION Dietary intake, weekly physical activity  Next Steps  Patient is to return PRN.

## 2023-07-21 NOTE — Patient Instructions (Addendum)
1- Decreasing/Limiting intake of sugary sweetened beverages

## 2023-07-22 ENCOUNTER — Ambulatory Visit
Admission: RE | Admit: 2023-07-22 | Discharge: 2023-07-22 | Disposition: A | Discharge status: Home / Self Care | Payer: Managed Care, Other (non HMO) | Source: Ambulatory Visit | Attending: Obstetrics and Gynecology | Admitting: Obstetrics and Gynecology

## 2023-07-22 DIAGNOSIS — Z1231 Encounter for screening mammogram for malignant neoplasm of breast: Secondary | ICD-10-CM

## 2023-08-01 ENCOUNTER — Other Ambulatory Visit: Payer: Self-pay | Admitting: Obstetrics and Gynecology

## 2023-08-01 DIAGNOSIS — R928 Other abnormal and inconclusive findings on diagnostic imaging of breast: Secondary | ICD-10-CM

## 2023-08-15 IMAGING — MG MM DIGITAL SCREENING BILAT W/ TOMO AND CAD
8 series · 9 of 24 positions shown · non-contrast
Comparison: Previous exam(s).

CLINICAL DATA: Screening.

EXAM:
DIGITAL SCREENING BILATERAL MAMMOGRAM WITH TOMOSYNTHESIS AND CAD
TECHNIQUE: Bilateral screening digital craniocaudal and mediolateral oblique
mammograms were obtained. Bilateral screening digital breast
tomosynthesis was performed. The images were evaluated with
computer-aided detection.

[L CC synth-2D]
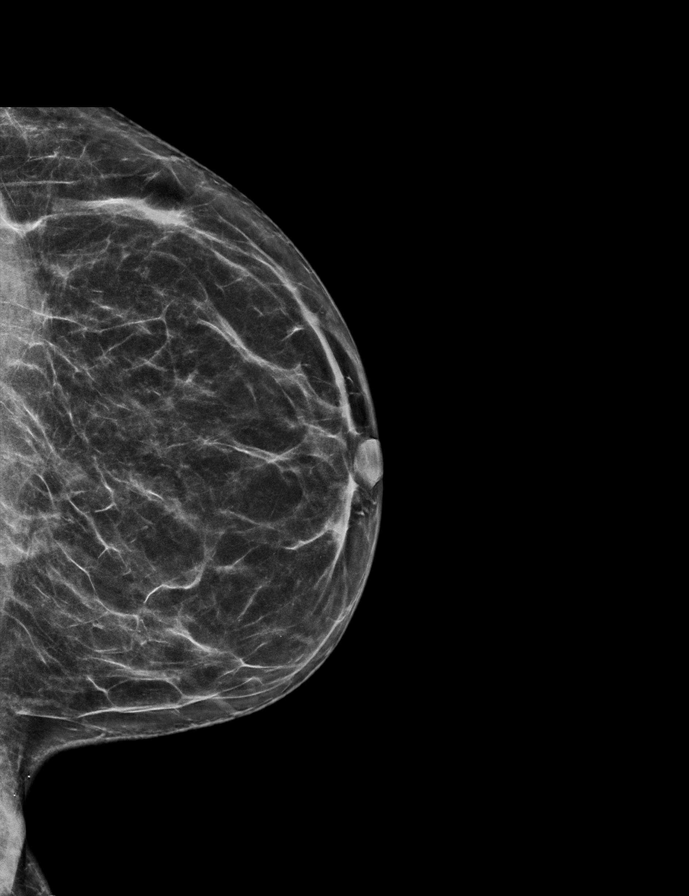

[L MLO synth-2D]
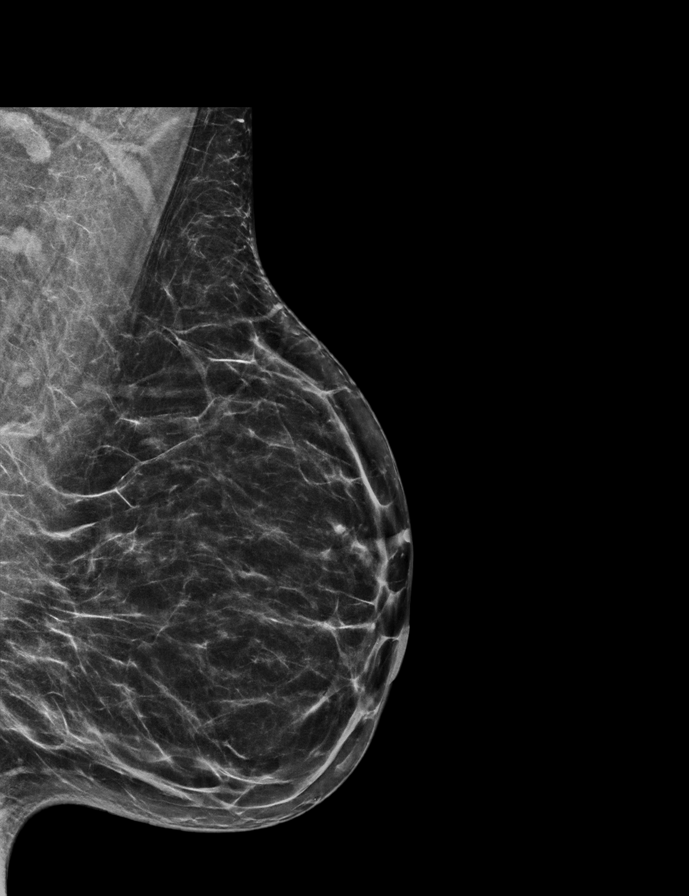

[R CC synth-2D]
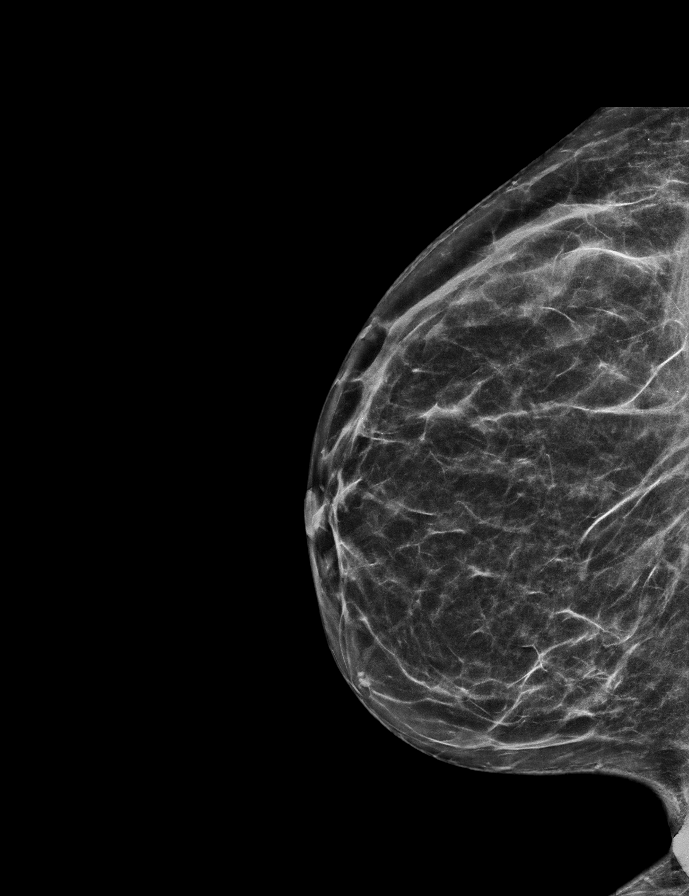

[R MLO synth-2D]
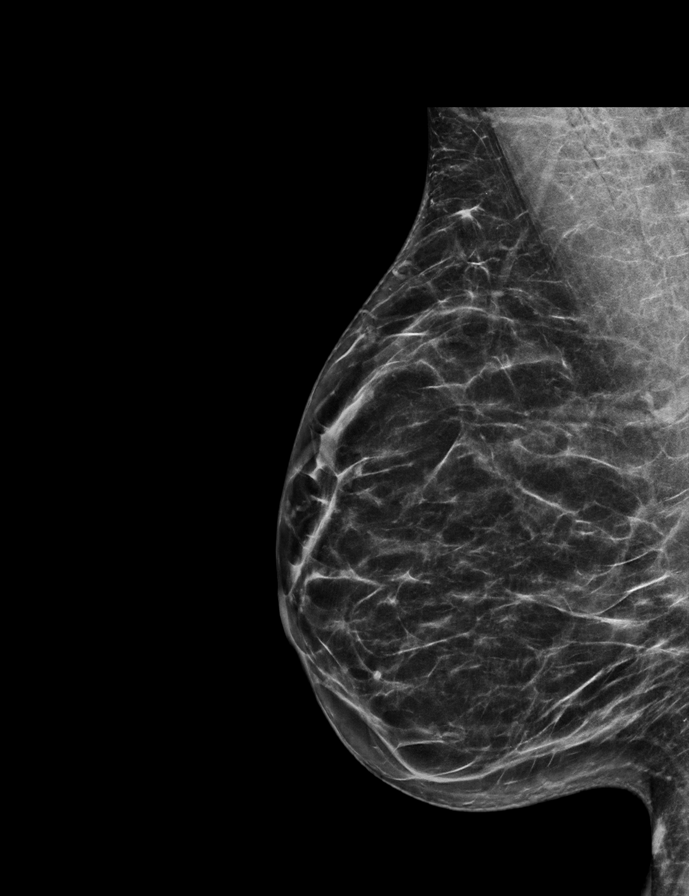

[R MLO tomo · 2 of 59 frames shown]
[frame 20/59]
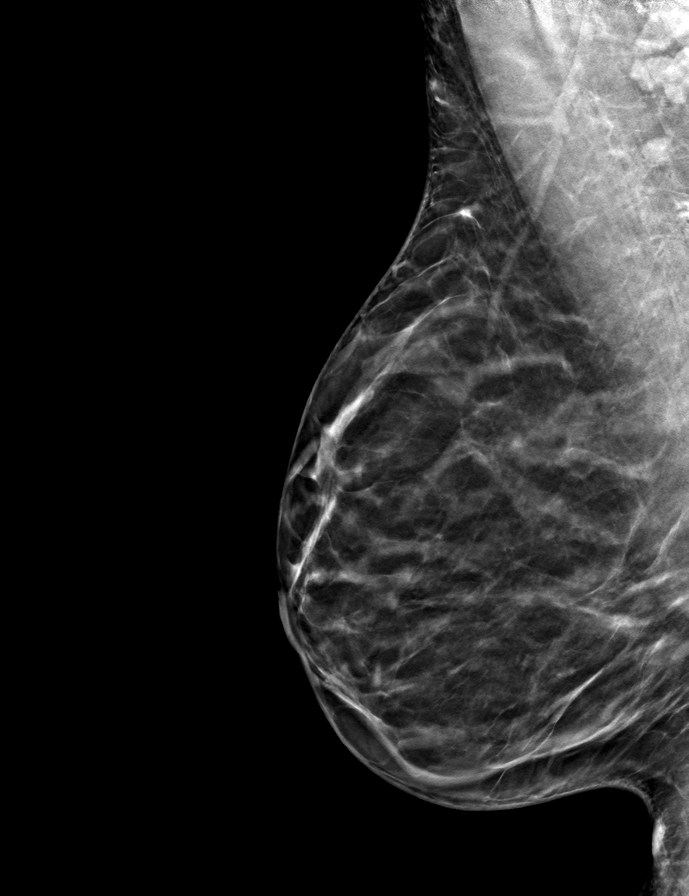
[frame 30/59]
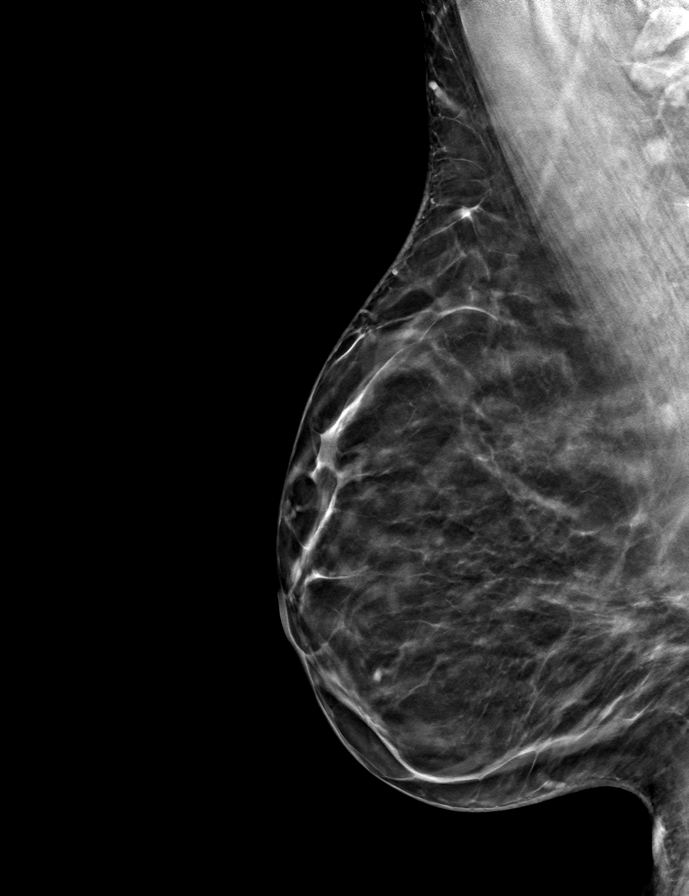

[L CC tomo · tomo slice 29/57.0]
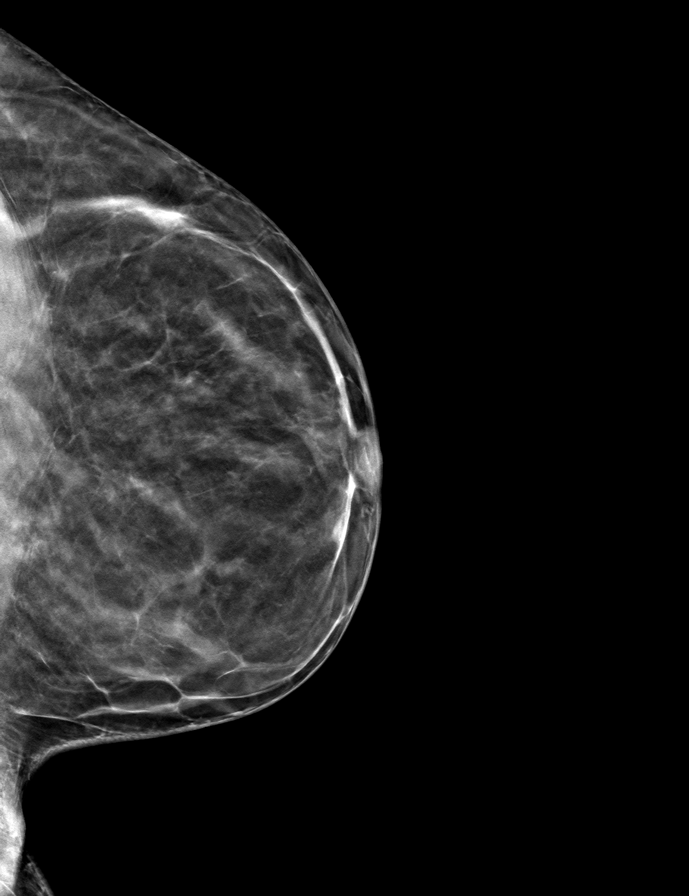

[L MLO tomo · tomo slice 31/60.0]
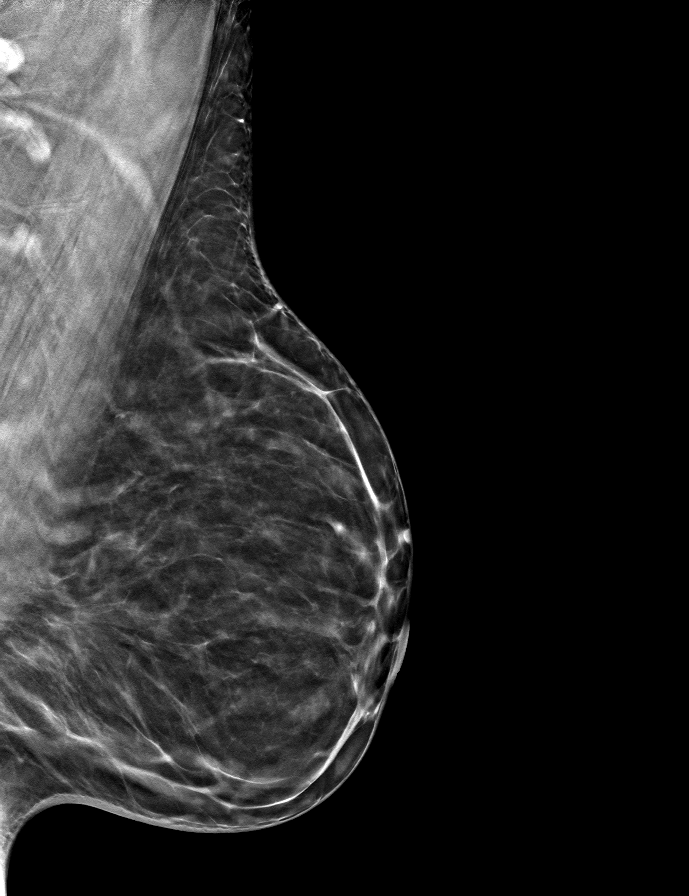

[R CC tomo · tomo slice 28/55.0]
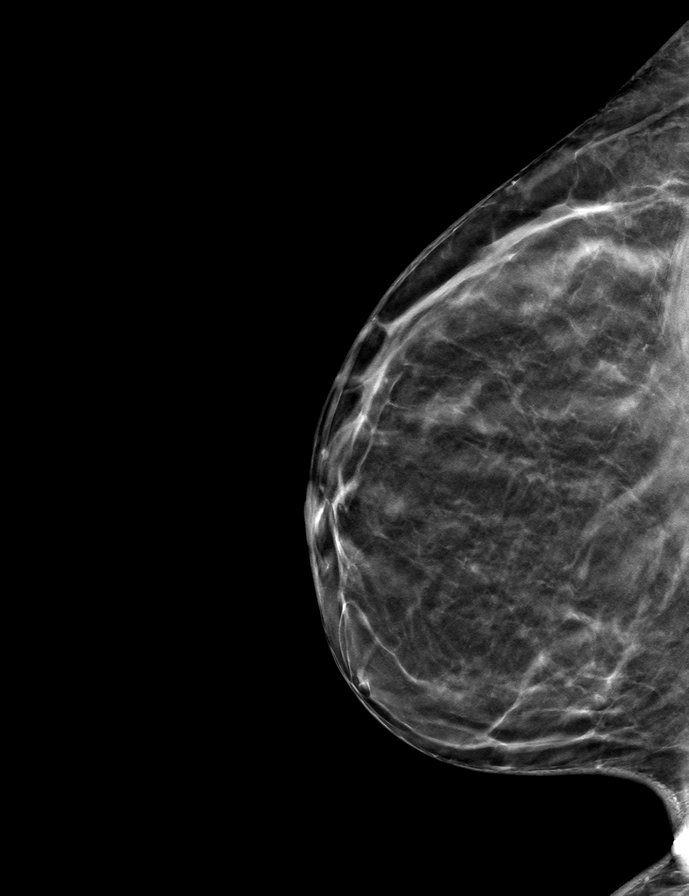

[9 of 24 positions shown; findings below may reference images not displayed]

ACR Breast Density Category b: There are scattered areas of
fibroglandular density.
FINDINGS: There are no findings suspicious for malignancy.
IMPRESSION: No mammographic evidence of malignancy. A result letter of this
screening mammogram will be mailed directly to the patient.

RECOMMENDATION:
Screening mammogram in one year. (Code:51-O-LD2)

BI-RADS CATEGORY  1: Negative.

## 2023-08-17 ENCOUNTER — Ambulatory Visit
Admission: RE | Admit: 2023-08-17 | Discharge: 2023-08-17 | Disposition: A | Discharge status: Home / Self Care | Payer: Managed Care, Other (non HMO) | Source: Ambulatory Visit | Attending: Obstetrics and Gynecology | Admitting: Obstetrics and Gynecology

## 2023-08-17 DIAGNOSIS — R928 Other abnormal and inconclusive findings on diagnostic imaging of breast: Secondary | ICD-10-CM

## 2024-06-18 ENCOUNTER — Other Ambulatory Visit: Payer: Self-pay | Admitting: Obstetrics and Gynecology

## 2024-06-18 DIAGNOSIS — Z1231 Encounter for screening mammogram for malignant neoplasm of breast: Secondary | ICD-10-CM

## 2024-07-23 ENCOUNTER — Inpatient Hospital Stay
Admission: RE | Admit: 2024-07-23 | Discharge: 2024-07-23 | Attending: Obstetrics and Gynecology | Admitting: Obstetrics and Gynecology

## 2024-07-23 DIAGNOSIS — Z1231 Encounter for screening mammogram for malignant neoplasm of breast: Secondary | ICD-10-CM
